# Patient Record
Sex: Female | Born: 1935 | Race: Black or African American | Hispanic: No | Smoking: Never smoker
Health system: Southern US, Community
[De-identification: ages and names within clinical notes are randomized; demographics above are authoritative.]

## PROBLEM LIST (undated history)

## (undated) DIAGNOSIS — I10 Essential (primary) hypertension: Secondary | ICD-10-CM

## (undated) DIAGNOSIS — I4892 Unspecified atrial flutter: Secondary | ICD-10-CM

## (undated) DIAGNOSIS — E785 Hyperlipidemia, unspecified: Secondary | ICD-10-CM

## (undated) HISTORY — PX: ABDOMINAL HYSTERECTOMY: SHX81

---

## 2015-03-21 DIAGNOSIS — E780 Pure hypercholesterolemia: Secondary | ICD-10-CM | POA: Diagnosis not present

## 2015-03-21 DIAGNOSIS — E785 Hyperlipidemia, unspecified: Secondary | ICD-10-CM | POA: Diagnosis not present

## 2015-03-21 DIAGNOSIS — I10 Essential (primary) hypertension: Secondary | ICD-10-CM | POA: Diagnosis not present

## 2015-03-21 DIAGNOSIS — R739 Hyperglycemia, unspecified: Secondary | ICD-10-CM | POA: Diagnosis not present

## 2015-03-21 DIAGNOSIS — Z9119 Patient's noncompliance with other medical treatment and regimen: Secondary | ICD-10-CM | POA: Diagnosis not present

## 2015-06-27 DIAGNOSIS — E785 Hyperlipidemia, unspecified: Secondary | ICD-10-CM | POA: Diagnosis not present

## 2015-06-27 DIAGNOSIS — R634 Abnormal weight loss: Secondary | ICD-10-CM | POA: Diagnosis not present

## 2015-06-27 DIAGNOSIS — I10 Essential (primary) hypertension: Secondary | ICD-10-CM | POA: Diagnosis not present

## 2015-09-25 ENCOUNTER — Other Ambulatory Visit (HOSPITAL_COMMUNITY): Payer: Self-pay | Admitting: Internal Medicine

## 2015-09-25 DIAGNOSIS — E785 Hyperlipidemia, unspecified: Secondary | ICD-10-CM | POA: Diagnosis not present

## 2015-09-25 DIAGNOSIS — R634 Abnormal weight loss: Secondary | ICD-10-CM | POA: Diagnosis not present

## 2015-09-25 DIAGNOSIS — M1990 Unspecified osteoarthritis, unspecified site: Secondary | ICD-10-CM

## 2015-09-25 DIAGNOSIS — I10 Essential (primary) hypertension: Secondary | ICD-10-CM | POA: Diagnosis not present

## 2015-09-29 ENCOUNTER — Ambulatory Visit (HOSPITAL_COMMUNITY)
Admission: RE | Admit: 2015-09-29 | Discharge: 2015-09-29 | Disposition: A | Discharge status: Home / Self Care | Payer: Commercial Managed Care - HMO | Source: Ambulatory Visit | Attending: Internal Medicine | Admitting: Internal Medicine

## 2015-09-29 DIAGNOSIS — Z780 Asymptomatic menopausal state: Secondary | ICD-10-CM | POA: Diagnosis not present

## 2015-09-29 DIAGNOSIS — M1990 Unspecified osteoarthritis, unspecified site: Secondary | ICD-10-CM | POA: Insufficient documentation

## 2015-12-18 DIAGNOSIS — R42 Dizziness and giddiness: Secondary | ICD-10-CM | POA: Diagnosis not present

## 2015-12-18 DIAGNOSIS — I10 Essential (primary) hypertension: Secondary | ICD-10-CM | POA: Diagnosis not present

## 2015-12-18 DIAGNOSIS — R634 Abnormal weight loss: Secondary | ICD-10-CM | POA: Diagnosis not present

## 2016-02-19 DIAGNOSIS — Z0000 Encounter for general adult medical examination without abnormal findings: Secondary | ICD-10-CM | POA: Diagnosis not present

## 2016-02-19 DIAGNOSIS — I10 Essential (primary) hypertension: Secondary | ICD-10-CM | POA: Diagnosis not present

## 2016-02-19 DIAGNOSIS — E785 Hyperlipidemia, unspecified: Secondary | ICD-10-CM | POA: Diagnosis not present

## 2016-02-19 DIAGNOSIS — M810 Age-related osteoporosis without current pathological fracture: Secondary | ICD-10-CM | POA: Diagnosis not present

## 2016-05-27 DIAGNOSIS — I10 Essential (primary) hypertension: Secondary | ICD-10-CM | POA: Diagnosis not present

## 2016-05-27 DIAGNOSIS — E785 Hyperlipidemia, unspecified: Secondary | ICD-10-CM | POA: Diagnosis not present

## 2016-08-29 DIAGNOSIS — E785 Hyperlipidemia, unspecified: Secondary | ICD-10-CM | POA: Diagnosis not present

## 2016-08-29 DIAGNOSIS — R636 Underweight: Secondary | ICD-10-CM | POA: Diagnosis not present

## 2016-08-29 DIAGNOSIS — I10 Essential (primary) hypertension: Secondary | ICD-10-CM | POA: Diagnosis not present

## 2016-09-27 DIAGNOSIS — Z23 Encounter for immunization: Secondary | ICD-10-CM | POA: Diagnosis not present

## 2016-11-11 DIAGNOSIS — J111 Influenza due to unidentified influenza virus with other respiratory manifestations: Secondary | ICD-10-CM | POA: Diagnosis not present

## 2016-11-11 DIAGNOSIS — I10 Essential (primary) hypertension: Secondary | ICD-10-CM | POA: Diagnosis not present

## 2017-01-17 DIAGNOSIS — I10 Essential (primary) hypertension: Secondary | ICD-10-CM | POA: Diagnosis not present

## 2017-01-17 DIAGNOSIS — E785 Hyperlipidemia, unspecified: Secondary | ICD-10-CM | POA: Diagnosis not present

## 2017-04-24 DIAGNOSIS — R636 Underweight: Secondary | ICD-10-CM | POA: Diagnosis not present

## 2017-04-24 DIAGNOSIS — E785 Hyperlipidemia, unspecified: Secondary | ICD-10-CM | POA: Diagnosis not present

## 2017-04-24 DIAGNOSIS — I10 Essential (primary) hypertension: Secondary | ICD-10-CM | POA: Diagnosis not present

## 2017-08-19 DIAGNOSIS — Z0000 Encounter for general adult medical examination without abnormal findings: Secondary | ICD-10-CM | POA: Diagnosis not present

## 2017-08-19 DIAGNOSIS — I10 Essential (primary) hypertension: Secondary | ICD-10-CM | POA: Diagnosis not present

## 2017-08-19 DIAGNOSIS — E785 Hyperlipidemia, unspecified: Secondary | ICD-10-CM | POA: Diagnosis not present

## 2017-08-19 DIAGNOSIS — R739 Hyperglycemia, unspecified: Secondary | ICD-10-CM | POA: Diagnosis not present

## 2017-08-19 DIAGNOSIS — R634 Abnormal weight loss: Secondary | ICD-10-CM | POA: Diagnosis not present

## 2017-08-19 DIAGNOSIS — Z23 Encounter for immunization: Secondary | ICD-10-CM | POA: Diagnosis not present

## 2017-08-19 DIAGNOSIS — Z1389 Encounter for screening for other disorder: Secondary | ICD-10-CM | POA: Diagnosis not present

## 2017-08-19 DIAGNOSIS — H8110 Benign paroxysmal vertigo, unspecified ear: Secondary | ICD-10-CM | POA: Diagnosis not present

## 2017-08-28 ENCOUNTER — Other Ambulatory Visit (HOSPITAL_COMMUNITY): Payer: Self-pay | Admitting: Internal Medicine

## 2017-08-28 DIAGNOSIS — N183 Chronic kidney disease, stage 3 unspecified: Secondary | ICD-10-CM

## 2017-08-28 DIAGNOSIS — I10 Essential (primary) hypertension: Secondary | ICD-10-CM | POA: Diagnosis not present

## 2017-09-02 ENCOUNTER — Ambulatory Visit (HOSPITAL_COMMUNITY)
Admission: RE | Admit: 2017-09-02 | Discharge: 2017-09-02 | Disposition: A | Discharge status: Home / Self Care | Payer: Medicare HMO | Source: Ambulatory Visit | Attending: Internal Medicine | Admitting: Internal Medicine

## 2017-09-02 DIAGNOSIS — I10 Essential (primary) hypertension: Secondary | ICD-10-CM | POA: Diagnosis not present

## 2017-09-02 DIAGNOSIS — E785 Hyperlipidemia, unspecified: Secondary | ICD-10-CM | POA: Diagnosis not present

## 2017-09-02 DIAGNOSIS — N281 Cyst of kidney, acquired: Secondary | ICD-10-CM | POA: Diagnosis not present

## 2017-09-02 DIAGNOSIS — N183 Chronic kidney disease, stage 3 unspecified: Secondary | ICD-10-CM

## 2017-09-02 DIAGNOSIS — R739 Hyperglycemia, unspecified: Secondary | ICD-10-CM | POA: Diagnosis not present

## 2017-09-02 DIAGNOSIS — N189 Chronic kidney disease, unspecified: Secondary | ICD-10-CM | POA: Diagnosis not present

## 2017-11-27 DIAGNOSIS — E785 Hyperlipidemia, unspecified: Secondary | ICD-10-CM | POA: Diagnosis not present

## 2017-11-27 DIAGNOSIS — I10 Essential (primary) hypertension: Secondary | ICD-10-CM | POA: Diagnosis not present

## 2017-11-27 DIAGNOSIS — N183 Chronic kidney disease, stage 3 (moderate): Secondary | ICD-10-CM | POA: Diagnosis not present

## 2017-11-27 DIAGNOSIS — M1990 Unspecified osteoarthritis, unspecified site: Secondary | ICD-10-CM | POA: Diagnosis not present

## 2018-02-26 DIAGNOSIS — R634 Abnormal weight loss: Secondary | ICD-10-CM | POA: Diagnosis not present

## 2018-02-26 DIAGNOSIS — I10 Essential (primary) hypertension: Secondary | ICD-10-CM | POA: Diagnosis not present

## 2018-02-26 DIAGNOSIS — E785 Hyperlipidemia, unspecified: Secondary | ICD-10-CM | POA: Diagnosis not present

## 2018-05-26 DIAGNOSIS — R634 Abnormal weight loss: Secondary | ICD-10-CM | POA: Diagnosis not present

## 2018-05-26 DIAGNOSIS — N183 Chronic kidney disease, stage 3 (moderate): Secondary | ICD-10-CM | POA: Diagnosis not present

## 2018-05-26 DIAGNOSIS — I10 Essential (primary) hypertension: Secondary | ICD-10-CM | POA: Diagnosis not present

## 2018-05-26 DIAGNOSIS — R739 Hyperglycemia, unspecified: Secondary | ICD-10-CM | POA: Diagnosis not present

## 2018-05-26 DIAGNOSIS — E785 Hyperlipidemia, unspecified: Secondary | ICD-10-CM | POA: Diagnosis not present

## 2018-08-25 DIAGNOSIS — Z1331 Encounter for screening for depression: Secondary | ICD-10-CM | POA: Diagnosis not present

## 2018-08-25 DIAGNOSIS — E43 Unspecified severe protein-calorie malnutrition: Secondary | ICD-10-CM | POA: Diagnosis not present

## 2018-08-25 DIAGNOSIS — Z23 Encounter for immunization: Secondary | ICD-10-CM | POA: Diagnosis not present

## 2018-08-25 DIAGNOSIS — N182 Chronic kidney disease, stage 2 (mild): Secondary | ICD-10-CM | POA: Diagnosis not present

## 2018-08-25 DIAGNOSIS — Z1389 Encounter for screening for other disorder: Secondary | ICD-10-CM | POA: Diagnosis not present

## 2018-08-25 DIAGNOSIS — I10 Essential (primary) hypertension: Secondary | ICD-10-CM | POA: Diagnosis not present

## 2018-08-25 DIAGNOSIS — Z0001 Encounter for general adult medical examination with abnormal findings: Secondary | ICD-10-CM | POA: Diagnosis not present

## 2018-11-24 DIAGNOSIS — E785 Hyperlipidemia, unspecified: Secondary | ICD-10-CM | POA: Diagnosis not present

## 2018-11-24 DIAGNOSIS — N182 Chronic kidney disease, stage 2 (mild): Secondary | ICD-10-CM | POA: Diagnosis not present

## 2018-11-24 DIAGNOSIS — I10 Essential (primary) hypertension: Secondary | ICD-10-CM | POA: Diagnosis not present

## 2018-11-24 DIAGNOSIS — E43 Unspecified severe protein-calorie malnutrition: Secondary | ICD-10-CM | POA: Diagnosis not present

## 2019-02-23 DIAGNOSIS — N183 Chronic kidney disease, stage 3 (moderate): Secondary | ICD-10-CM | POA: Diagnosis not present

## 2019-02-23 DIAGNOSIS — I10 Essential (primary) hypertension: Secondary | ICD-10-CM | POA: Diagnosis not present

## 2019-02-23 DIAGNOSIS — E785 Hyperlipidemia, unspecified: Secondary | ICD-10-CM | POA: Diagnosis not present

## 2019-08-19 DIAGNOSIS — Z1389 Encounter for screening for other disorder: Secondary | ICD-10-CM | POA: Diagnosis not present

## 2019-08-19 DIAGNOSIS — Z1331 Encounter for screening for depression: Secondary | ICD-10-CM | POA: Diagnosis not present

## 2019-08-19 DIAGNOSIS — E43 Unspecified severe protein-calorie malnutrition: Secondary | ICD-10-CM | POA: Diagnosis not present

## 2019-08-19 DIAGNOSIS — Z0001 Encounter for general adult medical examination with abnormal findings: Secondary | ICD-10-CM | POA: Diagnosis not present

## 2019-08-19 DIAGNOSIS — Z23 Encounter for immunization: Secondary | ICD-10-CM | POA: Diagnosis not present

## 2019-08-19 DIAGNOSIS — N1831 Chronic kidney disease, stage 3a: Secondary | ICD-10-CM | POA: Diagnosis not present

## 2019-08-19 DIAGNOSIS — I10 Essential (primary) hypertension: Secondary | ICD-10-CM | POA: Diagnosis not present

## 2019-09-18 DIAGNOSIS — I10 Essential (primary) hypertension: Secondary | ICD-10-CM | POA: Diagnosis not present

## 2019-09-18 DIAGNOSIS — N182 Chronic kidney disease, stage 2 (mild): Secondary | ICD-10-CM | POA: Diagnosis not present

## 2019-10-19 DIAGNOSIS — N182 Chronic kidney disease, stage 2 (mild): Secondary | ICD-10-CM | POA: Diagnosis not present

## 2019-10-19 DIAGNOSIS — I10 Essential (primary) hypertension: Secondary | ICD-10-CM | POA: Diagnosis not present

## 2019-11-09 ENCOUNTER — Ambulatory Visit (INDEPENDENT_AMBULATORY_CARE_PROVIDER_SITE_OTHER): Payer: Medicare HMO | Admitting: Sports Medicine

## 2019-11-09 ENCOUNTER — Other Ambulatory Visit: Payer: Self-pay

## 2019-11-09 ENCOUNTER — Encounter: Payer: Self-pay | Admitting: Sports Medicine

## 2019-11-09 VITALS — BP 124/80 | HR 93 | Temp 96.7°F | Resp 16

## 2019-11-09 DIAGNOSIS — B351 Tinea unguium: Secondary | ICD-10-CM | POA: Diagnosis not present

## 2019-11-09 DIAGNOSIS — I739 Peripheral vascular disease, unspecified: Secondary | ICD-10-CM

## 2019-11-09 DIAGNOSIS — M79674 Pain in right toe(s): Secondary | ICD-10-CM | POA: Diagnosis not present

## 2019-11-09 DIAGNOSIS — M79675 Pain in left toe(s): Secondary | ICD-10-CM

## 2019-11-09 NOTE — Progress Notes (Signed)
Subjective: Beth Terry is a 86 y.o. female patient seen today in office with complaint of mildly painful thickened and elongated toenails; unable to trim. Patient is assisted by daughter who reports that her mom's nails has gotten long unable to trim or care for herself. Patient has no other pedal complaints at this time.   Review of Systems  All other systems reviewed and are negative.    There are no problems to display for this patient.   Current Outpatient Medications on File Prior to Visit  Medication Sig Dispense Refill  . benazepril-hydrochlorthiazide (LOTENSIN HCT) 20-25 MG tablet     . rosuvastatin (CRESTOR) 5 MG tablet      No current facility-administered medications on file prior to visit.    No Known Allergies  Objective: Physical Exam  General: Well developed, nourished, no acute distress, awake, alert and oriented x 3  Vascular: Dorsalis pedis artery 1/4 bilateral, Posterior tibial artery 0/4 bilateral, skin temperature warm to warm proximal to distal bilateral lower extremities, no varicosities, pedal hair present bilateral.  Neurological: Gross sensation present via light touch bilateral.   Dermatological: Skin is warm, dry, and supple bilateral, Nails 1-10 are tender, severely long, thick, and discolored with mild subungal debris, no webspace macerations present bilateral, no open lesions present bilateral, no callus/corns/hyperkeratotic tissue present bilateral. No signs of infection bilateral.  Musculoskeletal: Asymptomatic mild hammertoe boney deformities noted bilateral. Muscular strength within normal limits without painon range of motion. No pain with calf compression bilateral.  Assessment and Plan:  Problem List Items Addressed This Visit    None    Visit Diagnoses    Pain due to onychomycosis of toenails of both feet    -  Primary   Toe pain, bilateral       PVD (peripheral vascular disease) (HCC)       Relevant Medications   benazepril-hydrochlorthiazide (LOTENSIN HCT) 20-25 MG tablet   rosuvastatin (CRESTOR) 5 MG tablet      -Examined patient.  -Discussed treatment options for painful mycotic nails. -Mechanically debrided and reduced mycotic nails with sterile nail nipper and dremel nail file without incident. -Patient to return in 3 months for follow up evaluation/nail trim or sooner if symptoms worsen.  Asencion Islam, DPM

## 2019-11-19 ENCOUNTER — Other Ambulatory Visit: Payer: Self-pay

## 2019-11-19 ENCOUNTER — Other Ambulatory Visit (HOSPITAL_COMMUNITY)
Admission: RE | Admit: 2019-11-19 | Discharge: 2019-11-19 | Disposition: A | Discharge status: Home / Self Care | Payer: Medicare HMO | Source: Ambulatory Visit | Attending: Internal Medicine | Admitting: Internal Medicine

## 2019-11-19 ENCOUNTER — Emergency Department (HOSPITAL_COMMUNITY)
Admission: EM | Admit: 2019-11-19 | Discharge: 2019-11-19 | Disposition: A | Discharge status: Home / Self Care | Payer: Medicare HMO | Attending: Emergency Medicine | Admitting: Emergency Medicine

## 2019-11-19 ENCOUNTER — Ambulatory Visit: Payer: Medicaid Other | Admitting: Cardiovascular Disease

## 2019-11-19 ENCOUNTER — Emergency Department (HOSPITAL_COMMUNITY): Payer: Medicare HMO

## 2019-11-19 ENCOUNTER — Ambulatory Visit (INDEPENDENT_AMBULATORY_CARE_PROVIDER_SITE_OTHER): Payer: Medicare HMO | Admitting: Podiatry

## 2019-11-19 ENCOUNTER — Other Ambulatory Visit (INDEPENDENT_AMBULATORY_CARE_PROVIDER_SITE_OTHER): Payer: Medicaid Other

## 2019-11-19 ENCOUNTER — Encounter (HOSPITAL_COMMUNITY): Payer: Self-pay

## 2019-11-19 ENCOUNTER — Ambulatory Visit: Payer: Medicaid Other

## 2019-11-19 DIAGNOSIS — E785 Hyperlipidemia, unspecified: Secondary | ICD-10-CM | POA: Insufficient documentation

## 2019-11-19 DIAGNOSIS — M79675 Pain in left toe(s): Secondary | ICD-10-CM | POA: Diagnosis not present

## 2019-11-19 DIAGNOSIS — I4892 Unspecified atrial flutter: Secondary | ICD-10-CM | POA: Insufficient documentation

## 2019-11-19 DIAGNOSIS — Z0001 Encounter for general adult medical examination with abnormal findings: Secondary | ICD-10-CM | POA: Insufficient documentation

## 2019-11-19 DIAGNOSIS — I10 Essential (primary) hypertension: Secondary | ICD-10-CM | POA: Insufficient documentation

## 2019-11-19 DIAGNOSIS — Z79899 Other long term (current) drug therapy: Secondary | ICD-10-CM | POA: Insufficient documentation

## 2019-11-19 DIAGNOSIS — B351 Tinea unguium: Secondary | ICD-10-CM | POA: Diagnosis not present

## 2019-11-19 DIAGNOSIS — R000 Tachycardia, unspecified: Secondary | ICD-10-CM

## 2019-11-19 DIAGNOSIS — N182 Chronic kidney disease, stage 2 (mild): Secondary | ICD-10-CM | POA: Diagnosis not present

## 2019-11-19 DIAGNOSIS — M79674 Pain in right toe(s): Secondary | ICD-10-CM

## 2019-11-19 DIAGNOSIS — I739 Peripheral vascular disease, unspecified: Secondary | ICD-10-CM

## 2019-11-19 HISTORY — DX: Essential (primary) hypertension: I10

## 2019-11-19 HISTORY — DX: Hyperlipidemia, unspecified: E78.5

## 2019-11-19 LAB — COMPREHENSIVE METABOLIC PANEL
ALT: 20 U/L (ref 0–44)
AST: 24 U/L (ref 15–41)
Albumin: 3.6 g/dL (ref 3.5–5.0)
Alkaline Phosphatase: 60 U/L (ref 38–126)
Anion gap: 13 (ref 5–15)
BUN: 27 mg/dL — ABNORMAL HIGH (ref 8–23)
CO2: 25 mmol/L (ref 22–32)
Calcium: 9.6 mg/dL (ref 8.9–10.3)
Chloride: 104 mmol/L (ref 98–111)
Creatinine, Ser: 1.1 mg/dL — ABNORMAL HIGH (ref 0.44–1.00)
GFR calc Af Amer: 51 mL/min — ABNORMAL LOW (ref >60)
GFR calc non Af Amer: 44 mL/min — ABNORMAL LOW (ref >60)
Glucose, Bld: 98 mg/dL (ref 70–99)
Potassium: 4 mmol/L (ref 3.5–5.1)
Sodium: 142 mmol/L (ref 135–145)
Total Bilirubin: 1.2 mg/dL (ref 0.3–1.2)
Total Protein: 8.1 g/dL (ref 6.5–8.1)

## 2019-11-19 LAB — BASIC METABOLIC PANEL
Anion gap: 11 (ref 5–15)
BUN: 27 mg/dL — ABNORMAL HIGH (ref 8–23)
CO2: 27 mmol/L (ref 22–32)
Calcium: 9.4 mg/dL (ref 8.9–10.3)
Chloride: 103 mmol/L (ref 98–111)
Creatinine, Ser: 1.16 mg/dL — ABNORMAL HIGH (ref 0.44–1.00)
GFR calc Af Amer: 48 mL/min — ABNORMAL LOW (ref >60)
GFR calc non Af Amer: 41 mL/min — ABNORMAL LOW (ref >60)
Glucose, Bld: 112 mg/dL — ABNORMAL HIGH (ref 70–99)
Potassium: 3.4 mmol/L — ABNORMAL LOW (ref 3.5–5.1)
Sodium: 141 mmol/L (ref 135–145)

## 2019-11-19 LAB — CBC WITH DIFFERENTIAL/PLATELET
Abs Immature Granulocytes: 0.02 K/uL (ref 0.00–0.07)
Abs Immature Granulocytes: 0.01 K/uL (ref 0.00–0.07)
Basophils Absolute: 0 K/uL (ref 0.0–0.1)
Basophils Absolute: 0 K/uL (ref 0.0–0.1)
Basophils Relative: 1 %
Basophils Relative: 1 %
Eosinophils Absolute: 0 K/uL (ref 0.0–0.5)
Eosinophils Absolute: 0 K/uL (ref 0.0–0.5)
Eosinophils Relative: 1 %
Eosinophils Relative: 0 %
HCT: 48.2 % — ABNORMAL HIGH (ref 36.0–46.0)
HCT: 49.1 % — ABNORMAL HIGH (ref 36.0–46.0)
Hemoglobin: 14.9 g/dL (ref 12.0–15.0)
Hemoglobin: 15.3 g/dL — ABNORMAL HIGH (ref 12.0–15.0)
Immature Granulocytes: 0 %
Immature Granulocytes: 0 %
Lymphocytes Relative: 15 %
Lymphocytes Relative: 14 %
Lymphs Abs: 0.8 K/uL (ref 0.7–4.0)
Lymphs Abs: 0.8 K/uL (ref 0.7–4.0)
MCH: 28.3 pg (ref 26.0–34.0)
MCH: 28.4 pg (ref 26.0–34.0)
MCHC: 30.9 g/dL (ref 30.0–36.0)
MCHC: 31.2 g/dL (ref 30.0–36.0)
MCV: 91.6 fL (ref 80.0–100.0)
MCV: 91.3 fL (ref 80.0–100.0)
Monocytes Absolute: 0.4 K/uL (ref 0.1–1.0)
Monocytes Absolute: 0.4 K/uL (ref 0.1–1.0)
Monocytes Relative: 7 %
Monocytes Relative: 7 %
Neutro Abs: 4.2 K/uL (ref 1.7–7.7)
Neutro Abs: 4.4 K/uL (ref 1.7–7.7)
Neutrophils Relative %: 76 %
Neutrophils Relative %: 78 %
Platelets: 180 K/uL (ref 150–400)
Platelets: 183 K/uL (ref 150–400)
RBC: 5.26 MIL/uL — ABNORMAL HIGH (ref 3.87–5.11)
RBC: 5.38 MIL/uL — ABNORMAL HIGH (ref 3.87–5.11)
RDW: 15.8 % — ABNORMAL HIGH (ref 11.5–15.5)
RDW: 15.9 % — ABNORMAL HIGH (ref 11.5–15.5)
WBC: 5.5 K/uL (ref 4.0–10.5)
WBC: 5.6 K/uL (ref 4.0–10.5)
nRBC: 0 % (ref 0.0–0.2)
nRBC: 0 % (ref 0.0–0.2)

## 2019-11-19 LAB — HEPATIC FUNCTION PANEL
ALT: 20 U/L (ref 0–44)
AST: 26 U/L (ref 15–41)
Albumin: 3.7 g/dL (ref 3.5–5.0)
Alkaline Phosphatase: 60 U/L (ref 38–126)
Bilirubin, Direct: 0.2 mg/dL (ref 0.0–0.2)
Indirect Bilirubin: 1.1 mg/dL — ABNORMAL HIGH (ref 0.3–0.9)
Total Bilirubin: 1.3 mg/dL — ABNORMAL HIGH (ref 0.3–1.2)
Total Protein: 8.3 g/dL — ABNORMAL HIGH (ref 6.5–8.1)

## 2019-11-19 LAB — LIPID PANEL
Cholesterol: 209 mg/dL — ABNORMAL HIGH (ref 0–200)
HDL: 94 mg/dL
LDL Cholesterol: 99 mg/dL (ref 0–99)
Total CHOL/HDL Ratio: 2.2 ratio
Triglycerides: 81 mg/dL
VLDL: 16 mg/dL (ref 0–40)

## 2019-11-19 LAB — TROPONIN I (HIGH SENSITIVITY)
Troponin I (High Sensitivity): 9 ng/L (ref <18)
Troponin I (High Sensitivity): 11 ng/L

## 2019-11-19 MED ORDER — METOPROLOL TARTRATE 25 MG PO TABS
25.0000 mg | ORAL_TABLET | Freq: Once | ORAL | Status: AC
  Administered 2019-11-19: 25 mg via ORAL
  Filled 2019-11-19: qty 1

## 2019-11-19 MED ORDER — METOPROLOL TARTRATE 25 MG PO TABS: 25.0000 mg | ORAL_TABLET | Freq: Two times a day (BID) | ORAL | 11 refills | Status: DC

## 2019-11-19 NOTE — ED Provider Notes (Signed)
[facility] EMERGENCY DEPARTMENT Provider Note   CSN: [id number] Arrival date & time: 11/19/19  1228     History Chief Complaint  Patient presents with   Tachycardia    Beth Terry is a 86 y.o. female.  Pt's daughter reports pt was seen by Dr. Legrand Rams 2 months ago and was sent to hospital to have an EKG.  Dr. Legrand Rams informed daughter pt had a fast heart rate.  Pt's daughter brought pt in today because she was out of the home and wanted to get everything done. Pt denies any complains.  No chest pain. No shortness of breath   The history is provided by the patient. No language interpreter was used.  Palpitations Palpitations quality:  Fast Onset quality:  Chronic Timing:  Constant Progression:  Worsening Relieved by:  Nothing Worsened by:  Nothing Ineffective treatments:  None tried Associated symptoms: no chest pain, no shortness of breath and no syncope   Risk factors: no hx of PE        Past Medical History:  Diagnosis Date   Hyperlipidemia    Hypertension     There are no problems to display for this patient.   Past Surgical History:  Procedure Laterality Date   ABDOMINAL HYSTERECTOMY       OB History   No obstetric history on file.     No family history on file.  Social History   Tobacco Use   Smoking status: Never Smoker   Smokeless tobacco: Never Used  Substance Use Topics   Alcohol use: Never   Drug use: Never    Home Medications Prior to Admission medications   Medication Sig Start Date End Date Taking? Authorizing Provider  benazepril-hydrochlorthiazide (LOTENSIN HCT) 20-25 MG tablet  06/08/19  Yes [provider]  rosuvastatin (CRESTOR) 5 MG tablet  06/08/19  Yes [provider]    Allergies    Patient has no known allergies.  Review of Systems   Review of Systems  Respiratory: Negative for shortness of breath.   Cardiovascular: Positive for palpitations. Negative for chest pain and syncope.  All other  systems reviewed and are negative.   Physical Exam Updated Vital Signs BP (!) 168/98 (BP Location: Left Arm)    Pulse (!) 103    Temp 97.8 F (36.6 C) (Oral)    Resp (!) 25    Ht 5\' 6"  (1.676 m)    Wt 40.8 kg    SpO2 98%    BMI 14.53 kg/m   Physical Exam Vitals and nursing note reviewed.  Constitutional:      Appearance: She is well-developed.  HENT:     Head: Normocephalic.  Cardiovascular:     Rate and Rhythm: Tachycardia present.  Pulmonary:     Effort: Pulmonary effort is normal.  Abdominal:     General: There is no distension.  Musculoskeletal:        General: Normal range of motion.     Cervical back: Normal range of motion.  Skin:    General: Skin is warm.  Neurological:     General: No focal deficit present.     Mental Status: She is alert and oriented to person, place, and time.  Psychiatric:        Mood and Affect: Mood normal.     ED Results / Procedures / Treatments   Labs (all labs ordered are listed, but only abnormal results are displayed) Labs Reviewed  CBC WITH DIFFERENTIAL/PLATELET - Abnormal; Notable for the following  components:      Result Value   RBC 5.38 (*)    Hemoglobin 15.3 (*)    HCT 49.1 (*)    RDW 15.9 (*)    All other components within normal limits  COMPREHENSIVE METABOLIC PANEL - Abnormal; Notable for the following components:   BUN 27 (*)    Creatinine, Ser 1.10 (*)    GFR calc non Af Amer 44 (*)    GFR calc Af Amer 51 (*)    All other components within normal limits  TROPONIN I (HIGH SENSITIVITY)  TROPONIN I (HIGH SENSITIVITY)    EKG EKG Interpretation  Date/Time:  Friday November 19 2019 12:35:30 EST Ventricular Rate:  127 PR Interval:    QRS Duration: 78 QT Interval:  344 QTC Calculation: 491 R Axis:   54 Text Interpretation: Atrial flutter Ventricular premature complex Probable anteroseptal infarct, old Repolarization abnormality, prob rate related Baseline wander in lead(s) II A Flutter no prior ecg for comparison  Confirmed by Alvester Chou [phone]) on 11/19/2019 1:48:57 PM   Radiology DG Chest Port 1 View  Result Date: 11/19/2019 CLINICAL DATA:  Tachycardia EXAM: PORTABLE CHEST 1 VIEW COMPARISON:  None. FINDINGS: Relative hyperinflation and hyperlucency of the lungs, could suggest underlying emphysematous changes. No consolidative opacity. No convincing features of edema. No pneumothorax or effusion. Slight right diaphragmatic eventration is noted. The aorta is calcified. Cardiac silhouette appears borderline enlarged. No acute osseous or soft tissue abnormality. IMPRESSION: 1. Enlarged cardiac silhouette, may reflect cardiomegaly or pericardial effusion. 2. Findings suggestive of emphysematous changes. 3.  Aortic Atherosclerosis (ICD10-I70.0). Electronically Signed   By: Kreg Shropshire M.D.   On: 11/19/2019 15:15    Procedures Procedures (including critical care time)  Medications Ordered in ED Medications - No data to display  ED Course  I have reviewed the triage vital signs and the nursing notes.  Pertinent labs & imaging results that were available during my care of the patient were reviewed by me and considered in my medical decision making (see chart for details).    MDM Rules/Calculators/A&P                      MDM:  Pt's troponin is negative x 2.  I spoke to cardiology.  Dr. Cristal Deer advised to try metoprolol 25mg .  (try in Ed to make sure pt tolerates) I spoke with with pt about starting eliquis.  Pt does not want to start on a blood thinner.  Pt takes an aspirin every day. Final Clinical Impression(s) / ED Diagnoses Final diagnoses:  Atrial flutter, unspecified type (HCC)    Rx / DC Orders ED Discharge Orders         Ordered    metoprolol tartrate (LOPRESSOR) 25 MG tablet  2 times daily     11/19/19 1926           01/17/20 11/19/19 1930    01/17/20, MD 11/19/19 2045

## 2019-11-19 NOTE — ED Triage Notes (Signed)
Pt was sent by Dr Felecia Shelling due to elevated HR. Initially sent as a nurse visit at cardiology and EKG. EKG showed a flutter and HR 128. Denies pain

## 2019-11-19 NOTE — ED Notes (Signed)
Lab work collected on pt at cardiology

## 2019-11-19 NOTE — Discharge Instructions (Addendum)
Continue taking your aspirin every day.  Schedule to see Cardiology for evaluation.  Take metoprolol to try to slow your heart rate.

## 2019-11-19 NOTE — Progress Notes (Signed)
Pt arrives to cardiology for EKG per Dr.Fanta for tachycardia, Dr.Koneswaran will read

## 2019-11-23 ENCOUNTER — Encounter: Payer: Self-pay | Admitting: Cardiovascular Disease

## 2019-11-23 ENCOUNTER — Ambulatory Visit (INDEPENDENT_AMBULATORY_CARE_PROVIDER_SITE_OTHER): Payer: Medicare HMO | Admitting: Cardiovascular Disease

## 2019-11-23 ENCOUNTER — Encounter: Payer: Self-pay | Admitting: Podiatry

## 2019-11-23 ENCOUNTER — Other Ambulatory Visit: Payer: Self-pay

## 2019-11-23 VITALS — BP 156/100 | HR 62 | Ht 64.0 in | Wt 92.0 lb

## 2019-11-23 DIAGNOSIS — I10 Essential (primary) hypertension: Secondary | ICD-10-CM | POA: Diagnosis not present

## 2019-11-23 DIAGNOSIS — Z7189 Other specified counseling: Secondary | ICD-10-CM | POA: Diagnosis not present

## 2019-11-23 DIAGNOSIS — I4892 Unspecified atrial flutter: Secondary | ICD-10-CM | POA: Diagnosis not present

## 2019-11-23 MED ORDER — APIXABAN 2.5 MG PO TABS: 2.5000 mg | ORAL_TABLET | Freq: Two times a day (BID) | ORAL | 0 refills | Status: DC

## 2019-11-23 MED ORDER — APIXABAN 2.5 MG PO TABS: 2.5000 mg | ORAL_TABLET | Freq: Two times a day (BID) | ORAL | 6 refills | Status: DC

## 2019-11-23 NOTE — Progress Notes (Signed)
  Subjective:  Patient ID: Beth Terry, female    DOB: Sep 12, 1927,  MRN: [id number]  Chief Complaint  Patient presents with  . Foot Pain    pt is here for pain and discoloration in the toes, pt states that she is also looking to get a nail trim, pt is also concerned with foot pain on top of the left foot, going on for about 3-4 months, pain is elevated at night time.   86 y.o. female returns for the above complaint.  Patient presents with a concern for thickened elongated mycotic toenails x10.  Patient is known to Dr. Marylene Land for nail care.  However patient states that she has pain him the lower extremity when she has been walking for a while.  She states that the pain is also worsened at night when she has been sleeping.  Pain is resolved when foot is in a dependent position.  She would like to know if there is anything that could be done for this pain.  Objective:  There were no vitals filed for this visit. Podiatric Exam: Vascular: Diminished dorsalis pedis and posterior tibial pulses. Capillary return greater than 5 seconds. Temperature gradient is WNL. Skin turgor WNL  Sensorium: Normal Semmes Weinstein monofilament test. Normal tactile sensation bilaterally. Nail Exam: Pt has thick disfigured discolored nails with subungual debris noted bilateral entire nail hallux through fifth toenails Ulcer Exam: There is no evidence of ulcer or pre-ulcerative changes or infection. Orthopedic Exam: Muscle tone and strength are WNL. No limitations in general ROM. No crepitus or effusions noted. HAV  B/L.  Hammer toes 2-5  B/L. Skin: No Porokeratosis. No infection or ulcers  Assessment & Plan:  Patient was evaluated and treated and all questions answered.   Peripheral vascular disease -I explained to the patient the etiology of peripheral vascular disease and the claudication type of pain that patient is experiencing.  I explained to her there are various treatment options associated with it.  I  would like the patient to obtain vascular studies followed by follow-up with a vascular specialist.  Patient states understanding and will obtain the vascular studies for baseline vascular flow to the lower extremity.  If the vascular studies are positive for decreased flow I will refer the patient over to a vascular specialist for evaluation and management.   Onychomycosis with pain  -Nails palliatively debrided as below. -Educated on self-care  Procedure: Nail Debridement Rationale: pain  Type of Debridement: manual, sharp debridement. Instrumentation: Nail nipper, rotary burr. Number of Nails: 10  Procedures and Treatment: Consent by patient was obtained for treatment procedures. The patient understood the discussion of treatment and procedures well. All questions were answered thoroughly reviewed. Debridement of mycotic and hypertrophic toenails, 1 through 5 bilateral and clearing of subungual debris. No ulceration, no infection noted.  Return Visit-Office Procedure: Patient instructed to return to the office for a follow up visit 3 months for continued evaluation and treatment.  Nicholes Rough, DPM    No follow-ups on file.

## 2019-11-23 NOTE — Addendum Note (Signed)
Addended by: Lesle Chris on: 11/23/2019 12:02 PM   Modules accepted: Orders

## 2019-11-23 NOTE — Patient Instructions (Addendum)
Medication Instructions:   Stop Aspirin.      Begin Eliquis 2.5mg  twice a day   Continue all other current medications.  Labwork: none  Testing/Procedures:  Your physician has requested that you have an echocardiogram. Echocardiography is a painless test that uses sound waves to create images of your heart. It provides your doctor with information about the size and shape of your heart and how well your heart's chambers and valves are working. This procedure takes approximately one hour. There are no restrictions for this procedure. - DUE IN 2 WEEKS   Office will contact with results via phone or letter.    Follow-Up: 2 months   Any Other Special Instructions Will Be Listed Below (If Applicable). Nurse visit for EKG & vitals in 3 weeks.   If you need a refill on your cardiac medications before your next appointment, please call your pharmacy.

## 2019-11-23 NOTE — Progress Notes (Signed)
CARDIOLOGY CONSULT NOTE  Patient ID: Beth Terry MRN: [id number] DOB/AGE: 1926-10-19 86 y.o.  Admit date: (Not on file) Primary Physician: Avon Gully, MD  Reason for Consultation: Atrial flutter  HPI: Beth Terry is a 86 y.o. female who is being seen today for the evaluation of atrial flutter at the request of Avon Gully, MD.   I personally reviewed the ECG performed on 11/19/2019 which demonstrated rapid atrial flutter, 127 bpm.  Her PCP had sent her to the cardiology office for an EKG due to tachycardia that day.  After interpreting the EKG I asked my nurse that the patient be directed to the ED for heart rate control.  Past medical history also includes hypertension.  Blood pressure in the ED was 168/98.  She is also markedly hypertensive today.  Several blood tests were checked which included hemoglobin 15.3, BUN 27, creatinine 1.1.  Troponins were normal.  Chest x-ray showed an enlarged cardiac silhouette which may reflect cardiomegaly or pericardial effusion.  There were findings suggestive of emphysematous changes as well as aortic atherosclerosis.  She was started on metoprolol.  She was offered Eliquis for systemic anticoagulation but refused.  She takes an aspirin daily.  It appears she is has not received metoprolol yet.  Her daughter, Beth Terry, who has accompanied her today plans to go pick it up from the pharmacy today.  The patient denies any symptoms of chest pain, palpitations, shortness of breath, lightheadedness, dizziness, leg swelling, orthopnea, PND, and syncope.       No Known Allergies  Current Outpatient Medications  Medication Sig Dispense Refill  . benazepril-hydrochlorthiazide (LOTENSIN HCT) 20-25 MG tablet Take 1 tablet by mouth daily.     Marland Kitchen donepezil (ARICEPT) 5 MG tablet Take 5 mg by mouth daily.    . rosuvastatin (CRESTOR) 5 MG tablet Take 5 mg by mouth at bedtime.     . metoprolol tartrate (LOPRESSOR) 25 MG  tablet Take 1 tablet (25 mg total) by mouth 2 (two) times daily. (Patient not taking: Reported on 11/23/2019) 60 tablet 11   No current facility-administered medications for this visit.    Past Medical History:  Diagnosis Date  . Hyperlipidemia   . Hypertension     Past Surgical History:  Procedure Laterality Date  . ABDOMINAL HYSTERECTOMY      Social History   Socioeconomic History  . Marital status: Widowed    Spouse name: Not on file  . Number of children: Not on file  . Years of education: Not on file  . Highest education level: Not on file  Occupational History  . Not on file  Tobacco Use  . Smoking status: Never Smoker  . Smokeless tobacco: Never Used  Substance and Sexual Activity  . Alcohol use: Never  . Drug use: Never  . Sexual activity: Not on file  Other Topics Concern  . Not on file  Social History Narrative  . Not on file   Social Determinants of Health   Financial Resource Strain:   . Difficulty of Paying Living Expenses: Not on file  Food Insecurity:   . Worried About Programme researcher, broadcasting/film/video in the Last Year: Not on file  . Ran Out of Food in the Last Year: Not on file  Transportation Needs:   . Lack of Transportation (Medical): Not on file  . Lack of Transportation (Non-Medical): Not on file  Physical Activity:   . Days of Exercise per Week: Not on file  .  Minutes of Exercise per Session: Not on file  Stress:   . Feeling of Stress : Not on file  Social Connections:   . Frequency of Communication with Friends and Family: Not on file  . Frequency of Social Gatherings with Friends and Family: Not on file  . Attends Religious Services: Not on file  . Active Member of Clubs or Organizations: Not on file  . Attends Archivist Meetings: Not on file  . Marital Status: Not on file  Intimate Partner Violence:   . Fear of Current or Ex-Partner: Not on file  . Emotionally Abused: Not on file  . Physically Abused: Not on file  . Sexually Abused:  Not on file     No family history of premature CAD in 1st degree relatives.  Current Meds  Medication Sig  . benazepril-hydrochlorthiazide (LOTENSIN HCT) 20-25 MG tablet Take 1 tablet by mouth daily.   Marland Kitchen donepezil (ARICEPT) 5 MG tablet Take 5 mg by mouth daily.  . rosuvastatin (CRESTOR) 5 MG tablet Take 5 mg by mouth at bedtime.       Review of systems complete and found to be negative unless listed above in HPI    Physical exam Blood pressure (!) 160/105, pulse (!) 101, height 5\' 4"  (1.626 m), weight 92 lb (41.7 kg). General: NAD Neck: No JVD, no thyromegaly or thyroid nodule.  Lungs: Clear to auscultation bilaterally with normal respiratory effort. CV: Nondisplaced PMI.  Tachycardic, irregular rhythm, normal S1/S2, no S3, no murmur.  No peripheral edema.     Abdomen: Soft, nontender, no distention.  Skin: Intact without lesions or rashes.  Neurologic: Alert and oriented x 3.  Psych: Normal affect. Extremities: No clubbing or cyanosis.  HEENT: Normal.   ECG: Most recent ECG reviewed.   Labs: Lab Results  Component Value Date/Time   K 4.0 11/19/2019 03:07 PM   BUN 27 (H) 11/19/2019 03:07 PM   CREATININE 1.10 (H) 11/19/2019 03:07 PM   ALT 20 11/19/2019 03:07 PM   HGB 15.3 (H) 11/19/2019 03:07 PM     Lipids: Lab Results  Component Value Date/Time   LDLCALC 99 11/19/2019 11:09 AM   CHOL 209 (H) 11/19/2019 11:09 AM   TRIG 81 11/19/2019 11:09 AM   HDL 94 11/19/2019 11:09 AM        ASSESSMENT AND PLAN:  1.  Rapid atrial flutter: She will start metoprolol tartrate 25 mg twice daily today as prescribed in the ED.  I had a long discussion with her about the risk of thromboembolic disease.  I will discontinue aspirin and start low-dose apixaban 2.5 mg twice daily given her age and weight (41.8 kg).  After heart rate is controlled I will obtain an echocardiogram to evaluate cardiac structure and function.  I will obtain an EKG in the office in about 3 weeks.  I have asked  the patient's daughter to purchase a blood pressure cuff so heart rate and blood pressure can be checked at home.  2.  Hypertension: Blood pressure is significantly elevated.  She is starting metoprolol today.  This will need further monitoring.  I have encouraged the patient's daughter to purchase a blood pressure cuff so this can be monitored at home.     Disposition: Follow up in 2 months virtual visit   Signed: Kate Sable, M.D., F.A.C.C.  11/23/2019, 8:21 AM

## 2019-12-07 ENCOUNTER — Telehealth: Payer: Self-pay | Admitting: *Deleted

## 2019-12-07 DIAGNOSIS — I739 Peripheral vascular disease, unspecified: Secondary | ICD-10-CM

## 2019-12-07 DIAGNOSIS — M79674 Pain in right toe(s): Secondary | ICD-10-CM

## 2019-12-07 NOTE — Telephone Encounter (Signed)
Faxed orders to [facility].

## 2019-12-07 NOTE — Telephone Encounter (Signed)
-----   Message from Candelaria Stagers, DPM sent at 11/23/2019  8:01 AM EST ----- Regarding: Vascular studies Hi Valery,  Would you be able to order vascular studies for this patient with ABIs/PVRs.  Thanks Caryn Bee

## 2019-12-07 NOTE — Telephone Encounter (Addendum)
[facility] states their first available is [phone] is the end of March.

## 2019-12-08 ENCOUNTER — Other Ambulatory Visit: Payer: Self-pay | Admitting: Podiatry

## 2019-12-08 DIAGNOSIS — I739 Peripheral vascular disease, unspecified: Secondary | ICD-10-CM

## 2019-12-09 ENCOUNTER — Other Ambulatory Visit: Payer: Self-pay

## 2019-12-09 ENCOUNTER — Ambulatory Visit (INDEPENDENT_AMBULATORY_CARE_PROVIDER_SITE_OTHER): Payer: Medicare HMO

## 2019-12-09 DIAGNOSIS — I4892 Unspecified atrial flutter: Secondary | ICD-10-CM

## 2019-12-10 ENCOUNTER — Telehealth: Payer: Self-pay | Admitting: *Deleted

## 2019-12-10 NOTE — Telephone Encounter (Signed)
Beth Terry, [place]  04/19/6150 83:43 AM EST    Daughter Beth Terry) notified & verbalized understanding. Copy to pcp. Next f/u scheduled for 01/25/2020 - vv.

## 2019-12-10 NOTE — Telephone Encounter (Signed)
-----   Message from Laqueta Linden, MD sent at 12/10/2019  9:31 AM EST ----- Cardiac function is mild to moderately weak and I suspect this is due to uncontrolled tachycardia. As stated at the time of her office visit, her heart rate will need to be monitored to make sure it is well controlled. Moderate mitral and aortic valve as well as moderate to severe tricuspid valve leakage is noted. I will manage with medications.

## 2019-12-14 ENCOUNTER — Ambulatory Visit: Payer: Medicaid Other

## 2019-12-17 DIAGNOSIS — E785 Hyperlipidemia, unspecified: Secondary | ICD-10-CM | POA: Diagnosis not present

## 2019-12-17 DIAGNOSIS — N182 Chronic kidney disease, stage 2 (mild): Secondary | ICD-10-CM | POA: Diagnosis not present

## 2019-12-23 ENCOUNTER — Ambulatory Visit (INDEPENDENT_AMBULATORY_CARE_PROVIDER_SITE_OTHER): Payer: Medicare HMO | Admitting: *Deleted

## 2019-12-23 ENCOUNTER — Other Ambulatory Visit: Payer: Self-pay

## 2019-12-23 VITALS — BP 158/92 | HR 107 | Wt 91.4 lb

## 2019-12-23 DIAGNOSIS — I4892 Unspecified atrial flutter: Secondary | ICD-10-CM

## 2019-12-23 DIAGNOSIS — R000 Tachycardia, unspecified: Secondary | ICD-10-CM | POA: Diagnosis not present

## 2019-12-23 NOTE — Progress Notes (Signed)
Patient in office for EKG & vitals.  Heart rate 107 - 117.  Patient states that she feels fine & can not tell that heart is beating that fast.  No complaints this morning.  Daughter Chyrl Civatte) present for nurse visit.   [phone])

## 2019-12-23 NOTE — Progress Notes (Signed)
If she has not missed any doses of Eliquis, please arrange for direct-current cardioversion next week at [facility].

## 2019-12-29 NOTE — Progress Notes (Signed)
Spoke with daughter Chyrl Civatte) & explained procedure in detail - states that she is not sure about doing this - prefers to discuss with provider before making final decision.  VV scheduled for 01/03/2020 at 9:20.    The patient verbally consented for a telehealth phone visit with [facility] and understands that his/her insurance company will be billed for the encounter.

## 2020-01-04 ENCOUNTER — Encounter: Payer: Self-pay | Admitting: Cardiovascular Disease

## 2020-01-04 ENCOUNTER — Telehealth (INDEPENDENT_AMBULATORY_CARE_PROVIDER_SITE_OTHER): Payer: Medicare HMO | Admitting: Cardiovascular Disease

## 2020-01-04 VITALS — BP 101/64 | HR 82 | Ht 65.0 in | Wt 90.0 lb

## 2020-01-04 DIAGNOSIS — Z7901 Long term (current) use of anticoagulants: Secondary | ICD-10-CM

## 2020-01-04 DIAGNOSIS — R000 Tachycardia, unspecified: Secondary | ICD-10-CM

## 2020-01-04 DIAGNOSIS — I10 Essential (primary) hypertension: Secondary | ICD-10-CM | POA: Diagnosis not present

## 2020-01-04 DIAGNOSIS — I4892 Unspecified atrial flutter: Secondary | ICD-10-CM | POA: Diagnosis not present

## 2020-01-04 MED ORDER — METOPROLOL SUCCINATE ER 25 MG PO TB24: 25.0000 mg | ORAL_TABLET | Freq: Two times a day (BID) | ORAL | 6 refills | Status: AC

## 2020-01-04 NOTE — Addendum Note (Signed)
Addended by: Lesle Chris on: 01/04/2020 09:01 AM   Modules accepted: Orders

## 2020-01-04 NOTE — Progress Notes (Signed)
Virtual Visit via Telephone Note   This visit type was conducted due to national recommendations for restrictions regarding the COVID-19 Pandemic (e.g. social distancing) in an effort to limit this patient's exposure and mitigate transmission in our community.  Due to her co-morbid illnesses, this patient is at least at moderate risk for complications without adequate follow up.  This format is felt to be most appropriate for this patient at this time.  The patient did not have access to video technology/had technical difficulties with video requiring transitioning to audio format only (telephone).  All issues noted in this document were discussed and addressed.  No physical exam could be performed with this format.  Please refer to the patient's chart for her  consent to telehealth for [facility].   The patient was identified using 2 identifiers.  Date:  01/04/2020   ID:  Beth, Terry [birth year], MRN [id number]  Patient Location: Home Provider Location: Office  PCP:  Avon Gully, MD  Cardiologist:  Prentice Docker, MD  Electrophysiologist:  None   Evaluation Performed:  Follow-Up Visit  Chief Complaint:  Atrial flutter  History of Present Illness:    Beth Terry is a 86 y.o. female with atrial flutter.  I initially evaluated her on 11/23/2019 and started her on Lopressor 25 mg twice daily.  I obtained a follow-up ECG on 12/23/2019 which demonstrated rapid atrial flutter, 117 bpm.  I then communicated my suggestion about proceeding with direct-current cardioversion.  Echocardiogram demonstrated moderately reduced LV systolic function, EF 40%.  I spoke to the patient's daughter today, Beth Terry.  It appears that they are not interested in proceeding with cardioversion given the patient's advanced age.  The patient denies chest pain, palpitations, leg swelling, and shortness of breath.     Past Medical History:  Diagnosis Date  . Hyperlipidemia   .  Hypertension    Past Surgical History:  Procedure Laterality Date  . ABDOMINAL HYSTERECTOMY       Current Meds  Medication Sig  . apixaban (ELIQUIS) 2.5 MG TABS tablet Take 1 tablet (2.5 mg total) by mouth 2 (two) times daily.  . benazepril-hydrochlorthiazide (LOTENSIN HCT) 20-25 MG tablet Take 1 tablet by mouth daily.   Marland Kitchen donepezil (ARICEPT) 5 MG tablet Take 5 mg by mouth daily.  . metoprolol tartrate (LOPRESSOR) 25 MG tablet Take 1 tablet (25 mg total) by mouth 2 (two) times daily.  . rosuvastatin (CRESTOR) 5 MG tablet Take 5 mg by mouth at bedtime.      Allergies:   Patient has no known allergies.   Social History   Tobacco Use  . Smoking status: Never Smoker  . Smokeless tobacco: Never Used  Substance Use Topics  . Alcohol use: Never  . Drug use: Never     Family Hx: The patient's family history is not on file.  ROS:   Please see the history of present illness.     All other systems reviewed and are negative.   Prior CV studies:   The following studies were reviewed today:  Echocardiogram 12/09/19:  1. Left ventricular ejection fraction, by estimation, is 40%. The left  ventricle has mildly decreased function. The left ventricle demonstrates  regional wall motion abnormalities (see scoring diagram/findings for  description). Left ventricular diastolic  parameters are indeterminate.  2. Right ventricular systolic function is moderately reduced. The right  ventricular size is mildly enlarged. There is moderately elevated  pulmonary artery systolic pressure.  3. Left atrial size was severely  dilated.  4. Right atrial size was severely dilated.  5. There is partial prolapse of the posterior MV leaflet. The mitral  valve is abnormal. Moderate mitral valve regurgitation. No evidence of  mitral stenosis.  6. Tricuspid valve regurgitation is moderate to severe.  7. The aortic valve is tricuspid. Aortic valve regurgitation is moderate.  No aortic stenosis is  present.  8. The inferior vena cava is normal in size with greater than 50%  respiratory variability, suggesting right atrial pressure of 3 mmHg.   Labs/Other Tests and Data Reviewed:    EKG:  An ECG dated 12/23/2019 was personally reviewed today and demonstrated:  Rapid atrial flutter with variable conduction, 117 bpm.  Recent Labs: 11/19/2019: ALT 20; BUN 27; Creatinine, Ser 1.10; Hemoglobin 15.3; Platelets 183; Potassium 4.0; Sodium 142   Recent Lipid Panel Lab Results  Component Value Date/Time   CHOL 209 (H) 11/19/2019 11:09 AM   TRIG 81 11/19/2019 11:09 AM   HDL 94 11/19/2019 11:09 AM   CHOLHDL 2.2 11/19/2019 11:09 AM   LDLCALC 99 11/19/2019 11:09 AM    Wt Readings from Last 3 Encounters:  01/04/20 90 lb (40.8 kg)  12/23/19 91 lb 6.4 oz (41.5 kg)  11/23/19 92 lb (41.7 kg)     Objective:    Vital Signs:  BP 101/64   Pulse 82   Ht 5\' 5"  (1.651 m)   Wt 90 lb (40.8 kg)   BMI 14.98 kg/m    VITAL SIGNS:  reviewed  ASSESSMENT & PLAN:    1.  Rapid atrial flutter: Currently on Lopressor 25 mg twice daily.  As per patient's reported heart rate it appears to be controlled.  She is also taking low-dose apixaban 2.5 mg twice daily given her age and weight.  Echocardiogram demonstrated LVEF 40%, I suspect which is tachycardia mediated.  I discussed proceeding with direct-current cardioversion with the patient's daughter and they are not inclined to proceed.  Hence, I will manage medically.  Given reduced LVEF, I will switch Lopressor to Toprol-XL 25 mg twice daily.  I will have the patient return for a follow-up ECG in 3 weeks.  2.  Hypertension: Blood pressure is normal.  I will monitor given medication adjustments as noted above.    COVID-19 Education: The signs and symptoms of COVID-19 were discussed with the patient and how to seek care for testing (follow up with PCP or arrange E-visit).  The importance of social distancing was discussed today.  Time:   Today, I have  spent 25 minutes with the patient with telehealth technology discussing the above problems.     Medication Adjustments/Labs and Tests Ordered: Current medicines are reviewed at length with the patient today.  Concerns regarding medicines are outlined above.   Tests Ordered: No orders of the defined types were placed in this encounter.   Medication Changes: No orders of the defined types were placed in this encounter.   Follow Up:  Virtual Visit  in 3 month(s)  Signed, Kate Sable, MD  01/04/2020 8:34 AM    [place]

## 2020-01-04 NOTE — Patient Instructions (Addendum)
Medication Instructions:   Stop Lopressor (Metoprolol Tart) - short acting.   Begin Toprol XL 25mg  (Metoprolol Succinate - long acting) twice a day - new prescription sent to [facility].   Continue all other medications.    Labwork: none  Testing/Procedures: none  Follow-Up: 3 months - phone call   Any Other Special Instructions Will Be Listed Below (If Applicable). 3 weeks - nurse visit for EKG only   If you need a refill on your cardiac medications before your next appointment, please call your pharmacy.

## 2020-01-04 NOTE — Addendum Note (Signed)
Addended by: Lesle Chris on: 01/04/2020 09:11 AM   Modules accepted: Orders

## 2020-01-12 ENCOUNTER — Other Ambulatory Visit: Payer: Self-pay

## 2020-01-12 ENCOUNTER — Ambulatory Visit (INDEPENDENT_AMBULATORY_CARE_PROVIDER_SITE_OTHER): Payer: Medicare HMO

## 2020-01-12 ENCOUNTER — Other Ambulatory Visit: Payer: Self-pay | Admitting: Podiatry

## 2020-01-12 DIAGNOSIS — I739 Peripheral vascular disease, unspecified: Secondary | ICD-10-CM

## 2020-01-12 DIAGNOSIS — R0989 Other specified symptoms and signs involving the circulatory and respiratory systems: Secondary | ICD-10-CM | POA: Diagnosis not present

## 2020-01-12 DIAGNOSIS — M79674 Pain in right toe(s): Secondary | ICD-10-CM

## 2020-01-17 DIAGNOSIS — E785 Hyperlipidemia, unspecified: Secondary | ICD-10-CM | POA: Diagnosis not present

## 2020-01-17 DIAGNOSIS — N182 Chronic kidney disease, stage 2 (mild): Secondary | ICD-10-CM | POA: Diagnosis not present

## 2020-01-25 ENCOUNTER — Ambulatory Visit: Payer: Medicaid Other

## 2020-01-25 ENCOUNTER — Telehealth: Payer: Medicaid Other | Admitting: Cardiovascular Disease

## 2020-02-09 ENCOUNTER — Inpatient Hospital Stay (HOSPITAL_COMMUNITY)
Admission: EM | Admit: 2020-02-09 | Discharge: 2020-02-11 | DRG: 389 | Disposition: A | Discharge status: Hospice | Payer: Medicare HMO | Attending: Family Medicine | Admitting: Family Medicine

## 2020-02-09 ENCOUNTER — Inpatient Hospital Stay (HOSPITAL_COMMUNITY): Payer: Medicare HMO

## 2020-02-09 ENCOUNTER — Emergency Department (HOSPITAL_COMMUNITY): Payer: Medicare HMO

## 2020-02-09 ENCOUNTER — Other Ambulatory Visit: Payer: Self-pay

## 2020-02-09 ENCOUNTER — Encounter (HOSPITAL_COMMUNITY): Payer: Self-pay

## 2020-02-09 DIAGNOSIS — Z79899 Other long term (current) drug therapy: Secondary | ICD-10-CM | POA: Diagnosis not present

## 2020-02-09 DIAGNOSIS — R404 Transient alteration of awareness: Secondary | ICD-10-CM | POA: Diagnosis not present

## 2020-02-09 DIAGNOSIS — Z66 Do not resuscitate: Secondary | ICD-10-CM | POA: Diagnosis not present

## 2020-02-09 DIAGNOSIS — R109 Unspecified abdominal pain: Secondary | ICD-10-CM | POA: Diagnosis not present

## 2020-02-09 DIAGNOSIS — E860 Dehydration: Secondary | ICD-10-CM | POA: Diagnosis not present

## 2020-02-09 DIAGNOSIS — I429 Cardiomyopathy, unspecified: Secondary | ICD-10-CM | POA: Diagnosis present

## 2020-02-09 DIAGNOSIS — Z20822 Contact with and (suspected) exposure to covid-19: Secondary | ICD-10-CM | POA: Diagnosis present

## 2020-02-09 DIAGNOSIS — E785 Hyperlipidemia, unspecified: Secondary | ICD-10-CM | POA: Diagnosis not present

## 2020-02-09 DIAGNOSIS — Z4659 Encounter for fitting and adjustment of other gastrointestinal appliance and device: Secondary | ICD-10-CM

## 2020-02-09 DIAGNOSIS — R188 Other ascites: Secondary | ICD-10-CM | POA: Diagnosis present

## 2020-02-09 DIAGNOSIS — I110 Hypertensive heart disease with heart failure: Secondary | ICD-10-CM | POA: Diagnosis present

## 2020-02-09 DIAGNOSIS — N179 Acute kidney failure, unspecified: Secondary | ICD-10-CM | POA: Diagnosis not present

## 2020-02-09 DIAGNOSIS — Z01810 Encounter for preprocedural cardiovascular examination: Secondary | ICD-10-CM | POA: Diagnosis not present

## 2020-02-09 DIAGNOSIS — Z515 Encounter for palliative care: Secondary | ICD-10-CM

## 2020-02-09 DIAGNOSIS — E872 Acidosis, unspecified: Secondary | ICD-10-CM | POA: Diagnosis present

## 2020-02-09 DIAGNOSIS — K56609 Unspecified intestinal obstruction, unspecified as to partial versus complete obstruction: Secondary | ICD-10-CM | POA: Diagnosis not present

## 2020-02-09 DIAGNOSIS — Z8249 Family history of ischemic heart disease and other diseases of the circulatory system: Secondary | ICD-10-CM | POA: Diagnosis not present

## 2020-02-09 DIAGNOSIS — F0390 Unspecified dementia without behavioral disturbance: Secondary | ICD-10-CM | POA: Diagnosis present

## 2020-02-09 DIAGNOSIS — K5669 Other partial intestinal obstruction: Principal | ICD-10-CM | POA: Diagnosis present

## 2020-02-09 DIAGNOSIS — I083 Combined rheumatic disorders of mitral, aortic and tricuspid valves: Secondary | ICD-10-CM | POA: Diagnosis present

## 2020-02-09 DIAGNOSIS — Z7189 Other specified counseling: Secondary | ICD-10-CM

## 2020-02-09 DIAGNOSIS — I10 Essential (primary) hypertension: Secondary | ICD-10-CM | POA: Diagnosis present

## 2020-02-09 DIAGNOSIS — Z90710 Acquired absence of both cervix and uterus: Secondary | ICD-10-CM

## 2020-02-09 DIAGNOSIS — Z4682 Encounter for fitting and adjustment of non-vascular catheter: Secondary | ICD-10-CM | POA: Diagnosis not present

## 2020-02-09 DIAGNOSIS — Z978 Presence of other specified devices: Secondary | ICD-10-CM

## 2020-02-09 DIAGNOSIS — I4891 Unspecified atrial fibrillation: Secondary | ICD-10-CM | POA: Diagnosis not present

## 2020-02-09 DIAGNOSIS — Z7401 Bed confinement status: Secondary | ICD-10-CM | POA: Diagnosis not present

## 2020-02-09 DIAGNOSIS — Z7901 Long term (current) use of anticoagulants: Secondary | ICD-10-CM | POA: Diagnosis not present

## 2020-02-09 DIAGNOSIS — I5042 Chronic combined systolic (congestive) and diastolic (congestive) heart failure: Secondary | ICD-10-CM | POA: Diagnosis not present

## 2020-02-09 DIAGNOSIS — R000 Tachycardia, unspecified: Secondary | ICD-10-CM | POA: Diagnosis not present

## 2020-02-09 DIAGNOSIS — D689 Coagulation defect, unspecified: Secondary | ICD-10-CM | POA: Diagnosis not present

## 2020-02-09 DIAGNOSIS — I959 Hypotension, unspecified: Secondary | ICD-10-CM | POA: Diagnosis present

## 2020-02-09 DIAGNOSIS — I4892 Unspecified atrial flutter: Secondary | ICD-10-CM | POA: Diagnosis not present

## 2020-02-09 DIAGNOSIS — A419 Sepsis, unspecified organism: Secondary | ICD-10-CM

## 2020-02-09 DIAGNOSIS — I700 Atherosclerosis of aorta: Secondary | ICD-10-CM | POA: Diagnosis present

## 2020-02-09 DIAGNOSIS — R918 Other nonspecific abnormal finding of lung field: Secondary | ICD-10-CM | POA: Diagnosis not present

## 2020-02-09 DIAGNOSIS — R531 Weakness: Secondary | ICD-10-CM | POA: Diagnosis not present

## 2020-02-09 HISTORY — DX: Unspecified atrial flutter: I48.92

## 2020-02-09 LAB — URINALYSIS, ROUTINE W REFLEX MICROSCOPIC
Bilirubin Urine: NEGATIVE
Glucose, UA: NEGATIVE mg/dL
Ketones, ur: NEGATIVE mg/dL
Leukocytes,Ua: NEGATIVE
Nitrite: NEGATIVE
Protein, ur: >=300 mg/dL — AB
Specific Gravity, Urine: 1.022 (ref 1.005–1.030)
pH: 5 (ref 5.0–8.0)

## 2020-02-09 LAB — COMPREHENSIVE METABOLIC PANEL
ALT: <5 U/L (ref 0–44)
AST: 33 U/L (ref 15–41)
Albumin: 4.2 g/dL (ref 3.5–5.0)
Alkaline Phosphatase: 48 U/L (ref 38–126)
Anion gap: 20 — ABNORMAL HIGH (ref 5–15)
BUN: 59 mg/dL — ABNORMAL HIGH (ref 8–23)
CO2: 24 mmol/L (ref 22–32)
Calcium: 9.5 mg/dL (ref 8.9–10.3)
Chloride: 95 mmol/L — ABNORMAL LOW (ref 98–111)
Creatinine, Ser: 2.19 mg/dL — ABNORMAL HIGH (ref 0.44–1.00)
GFR calc Af Amer: 22 mL/min — ABNORMAL LOW (ref >60)
GFR calc non Af Amer: 19 mL/min — ABNORMAL LOW (ref >60)
Glucose, Bld: 165 mg/dL — ABNORMAL HIGH (ref 70–99)
Potassium: 4.5 mmol/L (ref 3.5–5.1)
Sodium: 139 mmol/L (ref 135–145)
Total Bilirubin: 1.3 mg/dL — ABNORMAL HIGH (ref 0.3–1.2)
Total Protein: 8.5 g/dL — ABNORMAL HIGH (ref 6.5–8.1)

## 2020-02-09 LAB — CBC WITH DIFFERENTIAL/PLATELET
Abs Immature Granulocytes: 0.1 10*3/uL — ABNORMAL HIGH (ref 0.00–0.07)
Basophils Absolute: 0 10*3/uL (ref 0.0–0.1)
Basophils Relative: 0 %
Eosinophils Absolute: 0 10*3/uL (ref 0.0–0.5)
Eosinophils Relative: 0 %
HCT: 48.7 % — ABNORMAL HIGH (ref 36.0–46.0)
Hemoglobin: 15.5 g/dL — ABNORMAL HIGH (ref 12.0–15.0)
Immature Granulocytes: 1 %
Lymphocytes Relative: 10 %
Lymphs Abs: 1.1 10*3/uL (ref 0.7–4.0)
MCH: 28.7 pg (ref 26.0–34.0)
MCHC: 31.8 g/dL (ref 30.0–36.0)
MCV: 90.2 fL (ref 80.0–100.0)
Monocytes Absolute: 0.8 10*3/uL (ref 0.1–1.0)
Monocytes Relative: 7 %
Neutro Abs: 9.5 10*3/uL — ABNORMAL HIGH (ref 1.7–7.7)
Neutrophils Relative %: 82 %
Platelets: 183 10*3/uL (ref 150–400)
RBC: 5.4 MIL/uL — ABNORMAL HIGH (ref 3.87–5.11)
RDW: 16.5 % — ABNORMAL HIGH (ref 11.5–15.5)
WBC: 11.6 10*3/uL — ABNORMAL HIGH (ref 4.0–10.5)
nRBC: 0 % (ref 0.0–0.2)

## 2020-02-09 LAB — RESPIRATORY PANEL BY RT PCR (FLU A&B, COVID)
Influenza A by PCR: NEGATIVE
Influenza B by PCR: NEGATIVE
SARS Coronavirus 2 by RT PCR: NEGATIVE

## 2020-02-09 LAB — LIPASE, BLOOD: Lipase: 48 U/L (ref 11–51)

## 2020-02-09 LAB — LACTIC ACID, PLASMA
Lactic Acid, Venous: 7.3 mmol/L (ref 0.5–1.9)
Lactic Acid, Venous: 5.7 mmol/L (ref 0.5–1.9)

## 2020-02-09 MED ORDER — SODIUM CHLORIDE 0.9 % IV BOLUS
1000.0000 mL | Freq: Once | INTRAVENOUS | Status: AC
  Administered 2020-02-09: 20:00:00 1000 mL via INTRAVENOUS

## 2020-02-09 MED ORDER — SODIUM CHLORIDE 0.9 % IV BOLUS
1000.0000 mL | Freq: Once | INTRAVENOUS | Status: AC
  Administered 2020-02-09: 1000 mL via INTRAVENOUS

## 2020-02-09 MED ORDER — METOPROLOL TARTRATE 5 MG/5ML IV SOLN
2.5000 mg | Freq: Three times a day (TID) | INTRAVENOUS | Status: DC
  Administered 2020-02-10: 2.5 mg via INTRAVENOUS
  Filled 2020-02-09: qty 5

## 2020-02-09 MED ORDER — ACETAMINOPHEN 325 MG PO TABS: 650.0000 mg | ORAL_TABLET | Freq: Four times a day (QID) | ORAL | Status: DC | PRN

## 2020-02-09 MED ORDER — ONDANSETRON HCL 4 MG/2ML IJ SOLN
4.0000 mg | Freq: Once | INTRAMUSCULAR | Status: AC
  Administered 2020-02-09: 4 mg via INTRAVENOUS
  Filled 2020-02-09: qty 2

## 2020-02-09 MED ORDER — ACETAMINOPHEN 325 MG PO TABS
650.0000 mg | ORAL_TABLET | Freq: Once | ORAL | Status: AC
  Administered 2020-02-09: 650 mg via ORAL
  Filled 2020-02-09: qty 2

## 2020-02-09 MED ORDER — SODIUM CHLORIDE 0.9 % IV SOLN: INTRAVENOUS | Status: DC

## 2020-02-09 MED ORDER — ONDANSETRON HCL 4 MG PO TABS: 4.0000 mg | ORAL_TABLET | Freq: Four times a day (QID) | ORAL | Status: DC | PRN

## 2020-02-09 MED ORDER — HEPARIN (PORCINE) 25000 UT/250ML-% IV SOLN
500.0000 [IU]/h | INTRAVENOUS | Status: DC
  Administered 2020-02-10 (×2): 550 [IU]/h via INTRAVENOUS
  Filled 2020-02-09 (×2): qty 250

## 2020-02-09 MED ORDER — DILTIAZEM HCL-DEXTROSE 125-5 MG/125ML-% IV SOLN (PREMIX)
5.0000 mg/h | INTRAVENOUS | Status: DC
  Administered 2020-02-09: 5 mg/h via INTRAVENOUS
  Administered 2020-02-10: 12.5 mg/h via INTRAVENOUS
  Filled 2020-02-09 (×3): qty 125

## 2020-02-09 MED ORDER — ACETAMINOPHEN 650 MG RE SUPP: 650.0000 mg | Freq: Four times a day (QID) | RECTAL | Status: DC | PRN

## 2020-02-09 MED ORDER — ONDANSETRON HCL 4 MG/2ML IJ SOLN: 4.0000 mg | Freq: Four times a day (QID) | INTRAMUSCULAR | Status: DC | PRN

## 2020-02-09 MED ORDER — FENTANYL CITRATE (PF) 100 MCG/2ML IJ SOLN
25.0000 ug | Freq: Once | INTRAMUSCULAR | Status: AC
  Administered 2020-02-09: 25 ug via INTRAVENOUS
  Filled 2020-02-09: qty 2

## 2020-02-09 MED ORDER — METOPROLOL SUCCINATE ER 25 MG PO TB24
25.0000 mg | ORAL_TABLET | Freq: Every day | ORAL | Status: DC
  Administered 2020-02-09: 25 mg via ORAL
  Filled 2020-02-09: qty 1

## 2020-02-09 NOTE — H&P (Signed)
TRH H&P    Patient Demographics:    Beth Terry, is a 86 y.o. female  MRN: [id number]  DOB - 1927-02-06  Admit Date - 02/09/2020  Referring MD/NP/PA: Gerhard Munch  Outpatient Primary MD for the patient is Avon Gully, MD  Patient coming from: Home  Chief complaint-generalized weakness   HPI:    Beth Terry  is a 86 y.o. female, with history of hyperlipidemia, hypertension, atrial flutter on anticoagulation who presented to ED with complaints of generalized weakness and fatigue.  As per family patient had not been eating and drinking well for past 4 to 5 days.  Also complained of pain to the right side of her abdomen.  She had a episode of vomiting yesterday but no diarrhea.  No history of fever, cough chest pain or shortness of breath. At this time patient denies any pain, she did receive 1 dose of IV fentanyl in the ED  In the ED, CT scan of the abdomen and pelvis was done which showed small bowel obstruction.  With transition point in the terminal ileal region.  Also patient found to be in A. fib with RVR, started on IV Cardizem.    Review of systems:    In addition to the HPI above,    All other systems reviewed and are negative.    Past History of the following :    Past Medical History:  Diagnosis Date  . Hyperlipidemia   . Hypertension       Past Surgical History:  Procedure Laterality Date  . ABDOMINAL HYSTERECTOMY        Social History:      Social History   Tobacco Use  . Smoking status: Never Smoker  . Smokeless tobacco: Never Used  Substance Use Topics  . Alcohol use: Never       Family History :   Family history is unremarkable   Home Medications:   Prior to Admission medications   Medication Sig Start Date End Date Taking? Authorizing Provider  acetaminophen (TYLENOL) 500 MG tablet Take 500 mg by mouth every 6 (six) hours as needed for mild  pain or moderate pain.   Yes [provider]  apixaban (ELIQUIS) 2.5 MG TABS tablet Take 1 tablet (2.5 mg total) by mouth 2 (two) times daily. 11/23/19  Yes Laqueta Linden, MD  benazepril-hydrochlorthiazide (LOTENSIN HCT) 20-25 MG tablet Take 1 tablet by mouth daily.  06/08/19  Yes [provider]  donepezil (ARICEPT) 5 MG tablet Take 5 mg by mouth daily. 11/19/19  Yes [provider]  metoprolol succinate (TOPROL XL) 25 MG 24 hr tablet Take 1 tablet (25 mg total) by mouth 2 (two) times daily. Patient taking differently: Take 25 mg by mouth in the morning and at bedtime.  01/04/20  Yes Laqueta Linden, MD  rosuvastatin (CRESTOR) 5 MG tablet Take 5 mg by mouth daily.  06/08/19  Yes [provider]     Allergies:    No Known Allergies   Physical Exam:   Vitals  Blood pressure 134/89,  pulse (!) 128, temperature 99.4 F (37.4 C), temperature source Rectal, resp. rate (!) 27, weight 40.8 kg, SpO2 94 %.  1.  General: Appears in no acute distress  2. Psychiatric: Alert, oriented x4, intact insight and judgment  3. Neurologic: Cranial nerves II through XII grossly intact, no focal deficit noted  4. HEENMT:  Atraumatic normocephalic, extraocular muscles are intact  5. Respiratory : Clear to auscultation bilaterally, no wheezing or crackles auscultated  6. Cardiovascular : S1-S2, regular, no murmur auscultated  7. Gastrointestinal:  Abdomen is soft, nontender, no organomegaly, bowel sounds high-pitched     Data Review:    CBC Recent Labs  Lab 02/09/20 1907  WBC 11.6*  HGB 15.5*  HCT 48.7*  PLT 183  MCV 90.2  MCH 28.7  MCHC 31.8  RDW 16.5*  LYMPHSABS 1.1  MONOABS 0.8  EOSABS 0.0  BASOSABS 0.0   ------------------------------------------------------------------------------------------------------------------  Results for orders placed or performed during the hospital encounter of 02/09/20 (from the past 48 hour(s))    Urinalysis, Routine w reflex microscopic     Status: Abnormal   Collection Time: 02/09/20  6:42 PM  Result Value Ref Range   Color, Urine YELLOW YELLOW   APPearance CLOUDY (A) CLEAR   Specific Gravity, Urine 1.022 1.005 - 1.030   pH 5.0 5.0 - 8.0   Glucose, UA NEGATIVE NEGATIVE mg/dL   Hgb urine dipstick SMALL (A) NEGATIVE   Bilirubin Urine NEGATIVE NEGATIVE   Ketones, ur NEGATIVE NEGATIVE mg/dL   Protein, ur >=454>=300 (A) NEGATIVE mg/dL   Nitrite NEGATIVE NEGATIVE   Leukocytes,Ua NEGATIVE NEGATIVE   RBC / HPF 6-10 0 - 5 RBC/hpf   WBC, UA 6-10 0 - 5 WBC/hpf   Bacteria, UA RARE (A) NONE SEEN   Squamous Epithelial / LPF 0-5 0 - 5   Mucus PRESENT    Hyaline Casts, UA PRESENT     Comment: Performed at [facility], [address] [place], [place] [address]  CBC with Differential     Status: Abnormal   Collection Time: 02/09/20  7:07 PM  Result Value Ref Range   WBC 11.6 (H) 4.0 - 10.5 K/uL   RBC 5.40 (H) 3.87 - 5.11 MIL/uL   Hemoglobin 15.5 (H) 12.0 - 15.0 g/dL   HCT 91.448.7 (H) 78.236.0 - 95.646.0 %   MCV 90.2 80.0 - 100.0 fL   MCH 28.7 26.0 - 34.0 pg   MCHC 31.8 30.0 - 36.0 g/dL   RDW 21.316.5 (H) 08.611.5 - 57.815.5 %   Platelets 183 150 - 400 K/uL   nRBC 0.0 0.0 - 0.2 %   Neutrophils Relative % 82 %   Neutro Abs 9.5 (H) 1.7 - 7.7 K/uL   Lymphocytes Relative 10 %   Lymphs Abs 1.1 0.7 - 4.0 K/uL   Monocytes Relative 7 %   Monocytes Absolute 0.8 0.1 - 1.0 K/uL   Eosinophils Relative 0 %   Eosinophils Absolute 0.0 0.0 - 0.5 K/uL   Basophils Relative 0 %   Basophils Absolute 0.0 0.0 - 0.1 K/uL   Immature Granulocytes 1 %   Abs Immature Granulocytes 0.10 (H) 0.00 - 0.07 K/uL    Comment: Performed at [facility], [address] [place], [place] [address]  Comprehensive metabolic panel     Status: Abnormal   Collection Time: 02/09/20  7:07 PM  Result Value Ref Range   Sodium 139 135 - 145 mmol/L   Potassium 4.5 3.5 - 5.1 mmol/L   Chloride 95 (L) 98 -  111 mmol/L   CO2 24 22 - 32 mmol/L    Glucose, Bld 165 (H) 70 - 99 mg/dL    Comment: Glucose reference range applies only to samples taken after fasting for at least 8 hours.   BUN 59 (H) 8 - 23 mg/dL   Creatinine, Ser 4.16 (H) 0.44 - 1.00 mg/dL   Calcium 9.5 8.9 - 38.4 mg/dL   Total Protein 8.5 (H) 6.5 - 8.1 g/dL   Albumin 4.2 3.5 - 5.0 g/dL   AST 33 15 - 41 U/L   ALT <5 0 - 44 U/L   Alkaline Phosphatase 48 38 - 126 U/L   Total Bilirubin 1.3 (H) 0.3 - 1.2 mg/dL   GFR calc non Af Amer 19 (L) >60 mL/min   GFR calc Af Amer 22 (L) >60 mL/min   Anion gap 20 (H) 5 - 15    Comment: Performed at [facility], [address], [place], [place] [address]  Lipase, blood     Status: None   Collection Time: 02/09/20  7:07 PM  Result Value Ref Range   Lipase 48 11 - 51 U/L    Comment: Performed at [facility], [address], [place], [place] [address]  Lactic acid, plasma     Status: Abnormal   Collection Time: 02/09/20  7:07 PM  Result Value Ref Range   Lactic Acid, Venous 7.3 (HH) 0.5 - 1.9 mmol/L    Comment: CRITICAL RESULT CALLED TO, READ BACK BY AND VERIFIED WITH: WESTON,L ON 02/09/20 AT 2110 BY LOY,C Performed at [facility], [address] [place], [place] [address]   Blood culture (routine x 2)     Status: None (Preliminary result)   Collection Time: 02/09/20  9:24 PM   Specimen: Vein; Blood  Result Value Ref Range   Specimen Description BLOOD LEFT ARM    Special Requests      BOTTLES DRAWN AEROBIC AND ANAEROBIC Blood Culture adequate volume Performed at [facility], [address] [place], [place] [address]    Culture PENDING    Report Status PENDING   Lactic acid, plasma     Status: Abnormal   Collection Time: 02/09/20  9:27 PM  Result Value Ref Range   Lactic Acid, Venous 5.7 (HH) 0.5 - 1.9 mmol/L    Comment: CRITICAL VALUE NOTED.  VALUE IS CONSISTENT WITH PREVIOUSLY REPORTED AND CALLED VALUE. Performed at [facility], [address], [place], [place] [address]   Blood culture (routine x 2)     Status: None  (Preliminary result)   Collection Time: 02/09/20  9:27 PM   Specimen: Vein; Blood  Result Value Ref Range   Specimen Description BLOOD LEFT HAND    Special Requests      BOTTLES DRAWN AEROBIC AND ANAEROBIC Blood Culture adequate volume Performed at [facility], [address], [place], [place] [address]    Culture PENDING    Report Status PENDING   Respiratory Panel by RT PCR (Flu A&B, Covid) - Nasopharyngeal Swab     Status: None   Collection Time: 02/09/20  9:29 PM   Specimen: Nasopharyngeal Swab  Result Value Ref Range   SARS Coronavirus 2 by RT PCR NEGATIVE NEGATIVE    Comment: (NOTE) SARS-CoV-2 target nucleic acids are NOT DETECTED. The SARS-CoV-2 RNA is generally detectable in upper respiratoy specimens during the acute phase of infection. The lowest concentration of SARS-CoV-2 viral copies this assay can detect is 131 copies/mL. A negative result does not preclude SARS-Cov-2 infection and  should not be used as the sole basis for treatment or other patient management decisions. A negative result may occur with  improper specimen collection/handling, submission of specimen other than nasopharyngeal swab, presence of viral mutation(s) within the areas targeted by this assay, and inadequate number of viral copies (<131 copies/mL). A negative result must be combined with clinical observations, patient history, and epidemiological information. The expected result is Negative. Fact Sheet for Patients:  PinkCheek.be Fact Sheet for Healthcare Providers:  GravelBags.it This test is not yet ap proved or cleared by the [place] FDA and  has been authorized for detection and/or diagnosis of SARS-CoV-2 by FDA under an Emergency Use Authorization (EUA). This EUA will remain  in effect (meaning this test can be used) for the duration of the COVID-19 declaration under Section 564(b)(1) of the Act, 21 U.S.C. section  360bbb-3(b)(1), unless the authorization is terminated or revoked sooner.    Influenza A by PCR NEGATIVE NEGATIVE   Influenza B by PCR NEGATIVE NEGATIVE    Comment: (NOTE) The Xpert Xpress SARS-CoV-2/FLU/RSV assay is intended as an aid in  the diagnosis of influenza from Nasopharyngeal swab specimens and  should not be used as a sole basis for treatment. Nasal washings and  aspirates are unacceptable for Xpert Xpress SARS-CoV-2/FLU/RSV  testing. Fact Sheet for Patients: PinkCheek.be Fact Sheet for Healthcare Providers: GravelBags.it This test is not yet approved or cleared by the [place] FDA and  has been authorized for detection and/or diagnosis of SARS-CoV-2 by  FDA under an Emergency Use Authorization (EUA). This EUA will remain  in effect (meaning this test can be used) for the duration of the  Covid-19 declaration under Section 564(b)(1) of the Act, 21  U.S.C. section 360bbb-3(b)(1), unless the authorization is  terminated or revoked. Performed at [facility], [address] [place], [place] [address]     Chemistries  Recent Labs  Lab 02/09/20 1907  NA 139  K 4.5  CL 95*  CO2 24  GLUCOSE 165*  BUN 59*  CREATININE 2.19*  CALCIUM 9.5  AST 33  ALT <5  ALKPHOS 48  BILITOT 1.3*   ------------------------------------------------------------------------------------------------------------------  ------------------------------------------------------------------------------------------------------------------ GFR: Estimated Creatinine Clearance: 10.8 mL/min (A) (by C-G formula based on SCr of 2.19 mg/dL (H)). Liver Function Tests: Recent Labs  Lab 02/09/20 1907  AST 33  ALT <5  ALKPHOS 48  BILITOT 1.3*  PROT 8.5*  ALBUMIN 4.2   Recent Labs  Lab 02/09/20 1907  LIPASE 48   No results for input(s): AMMONIA in the last 168 hours. Coagulation Profile: No results for input(s): INR, PROTIME in the  last 168 hours. Cardiac Enzymes: No results for input(s): CKTOTAL, CKMB, CKMBINDEX, TROPONINI in the last 168 hours. BNP (last 3 results) No results for input(s): PROBNP in the last 8760 hours. HbA1C: No results for input(s): HGBA1C in the last 72 hours. CBG: No results for input(s): GLUCAP in the last 168 hours. Lipid Profile: No results for input(s): CHOL, HDL, LDLCALC, TRIG, CHOLHDL, LDLDIRECT in the last 72 hours. Thyroid Function Tests: No results for input(s): TSH, T4TOTAL, FREET4, T3FREE, THYROIDAB in the last 72 hours. Anemia Panel: No results for input(s): VITAMINB12, FOLATE, FERRITIN, TIBC, IRON, RETICCTPCT in the last 72 hours.  --------------------------------------------------------------------------------------------------------------- Urine analysis:    Component Value Date/Time   COLORURINE YELLOW 02/09/2020 1842   APPEARANCEUR CLOUDY (A) 02/09/2020 1842   LABSPEC 1.022 02/09/2020 1842   PHURINE 5.0 02/09/2020 1842   GLUCOSEU NEGATIVE 02/09/2020 1842   HGBUR SMALL (A) 02/09/2020  1842   BILIRUBINUR NEGATIVE 02/09/2020 1842   KETONESUR NEGATIVE 02/09/2020 1842   PROTEINUR >=300 (A) 02/09/2020 1842   NITRITE NEGATIVE 02/09/2020 1842   LEUKOCYTESUR NEGATIVE 02/09/2020 1842      Imaging Results:    CT ABDOMEN PELVIS WO CONTRAST  Result Date: 02/09/2020 CLINICAL DATA:  Weak and lethargic right-sided pain EXAM: CT ABDOMEN AND PELVIS WITHOUT CONTRAST TECHNIQUE: Multidetector CT imaging of the abdomen and pelvis was performed following the standard protocol without IV contrast. COMPARISON:  Ultrasound 09/02/2017 FINDINGS: Lower chest: Lung bases demonstrate no acute consolidation or pleural effusion. Mild bronchiectasis in the lower lobes. Mild cardiomegaly. Hepatobiliary: No focal hepatic abnormality. No calcified gallstone or biliary dilatation Pancreas: Unremarkable. No pancreatic ductal dilatation or surrounding inflammatory changes. Spleen: Normal in size without  focal abnormality. Adrenals/Urinary Tract: Adrenal glands are normal. Kidneys show no hydronephrosis. Cyst in the lower pole of the right kidney measuring 3.6 cm. The bladder is unremarkable Stomach/Bowel: Moderate fluid distension of the stomach. Diffuse fluid distension of the small bowel measuring up to 3.3 cm. Fecalized segment of distal small bowel in the pelvis. Nondistended colon filled with stool. Suspected transition point in the pelvis slightly to the right of midline, though limited details due to absence of contrast, lack of intra-abdominal fat, and the presence of ascites. No bowel wall thickening. No intramural air. Negative appendix. Vascular/Lymphatic: Extensive aortic atherosclerosis. No aneurysm. No suspicious adenopathy Reproductive: Status post hysterectomy.  No adnexal mass. Other: No free air. Small amount of ascites within the abdomen and pelvis. Slight increase densities within posterior pelvic ascites fluid. Musculoskeletal: Degenerative changes with grade 1 anterolisthesis L5 on S1. No acute osseous abnormality. IMPRESSION: 1. Findings consistent with mechanical small bowel obstruction. Suspect that the transition point is in the terminal ileal region, slightly to the right of midline in the pelvis though details are limited by the absence of enteral contrast, paucity of fat in the region and presence of ascites. 2. Small amount of abdominal and pelvic ascites. Small amount high density ascites in the posterior pelvis, could be hemorrhagic or proteinaceous. Electronically Signed   By: Jasmine Pang M.D.   On: 02/09/2020 21:04   DG Chest Portable 1 View  Result Date: 02/09/2020 CLINICAL DATA:  Weakness EXAM: PORTABLE CHEST 1 VIEW COMPARISON:  11/19/2019 FINDINGS: Two small nodular opacities overlying the right mid lung. No new consolidation or edema. No pleural effusion or pneumothorax. Stable cardiomediastinal contours with normal heart size IMPRESSION: Two small nodular opacities  overlying the right mid lung. Chest CT could be considered as indicated in this patient. Electronically Signed   By: Guadlupe Spanish M.D.   On: 02/09/2020 19:24    My personal review of EKG: Rhythm atrial fibrillation   Assessment & Plan:    Active Problems:   SBO (small bowel obstruction) (HCC)   1. Atrial fibrillation with RVR-patient found to be in A. fib with RVR, started on IV Cardizem in the ED.  We will continue with Cardizem gtt.  Patient takes Eliquis at home.  Will hold p.o. Eliquis and start IV heparin per pharmacy consultation. 2. Small bowel obstruction-CT abdomen pelvis reveals small bowel obstruction with transition point at terminal ileum.  We will keep patient n.p.o., NG tube has been ordered in the ED.  Will obtain surgical consultation in a.m.  Start IV normal saline at 100 mL/h. 3. Lactic acidosis-patient found to have significant lactic acidosis of 7.3 with high anion gap of 20.  Patient does not have diabetes mellitus.  Blood glucose is stable.  Lactic acidosis likely from hypotension, dehydration.  Patient was hypotensive at the time of presentation in the ED.  Repeat lactic acid is down to 5.7.  Will follow lactic acid every 4 hours x2. 4. Acute kidney injury-patient baseline creatinine is 1.10, now presenting with creatinine of 2.19.  Likely prerenal azotemia from poor p.o. intake and dehydration.  Will start IV normal saline as above.  Follow renal function in a.m. 5. History of dementia-no behavior disturbance.  P.o. medications on hold as patient is currently n.p.o.  Will monitor closely. 6. History of hypertension-hold benazepril/HCTZ.  Will start metoprolol 2.5 mg IV every 8 hours.    DVT Prophylaxis-   Heparin  AM Labs Ordered, also please review Full Orders  Family Communication: Admission, patients condition and plan of care including tests being ordered have been discussed with the patient  who indicate understanding and agree with the plan and Code  Status.  Code Status: Full code  Admission status: Inpatient :The appropriate admission status for this patient is INPATIENT. Inpatient status is judged to be reasonable and necessary in order to provide the required intensity of service to ensure the patient's safety. The patient's presenting symptoms, physical exam findings, and initial radiographic and laboratory data in the context of their chronic comorbidities is felt to place them at high risk for further clinical deterioration. Furthermore, it is not anticipated that the patient will be medically stable for discharge from the hospital within 2 midnights of admission. The following factors support the admission status of inpatient.     The patient's presenting symptoms include generalized weakness. The worrisome physical exam findings include none. The initial radiographic and laboratory data are worrisome because of small bowel obstruction. The chronic co-morbidities include dementia.       * I certify that at the point of admission it is my clinical judgment that the patient will require inpatient hospital care spanning beyond 2 midnights from the point of admission due to high intensity of service, high risk for further deterioration and high frequency of surveillance required.*  Status is: Inpatient    Dispo: The patient is from: Home              Anticipated d/c is to: Home              Anticipated d/c date is:2020-03-07              Patient currently medically stable       Time spent in minutes : 60 min   Kyson Kupper S Yitzchak Kothari M.D

## 2020-02-09 NOTE — ED Notes (Signed)
Date and time results received: 02/09/20 2107 (use smartphrase ".now" to insert current time)  Test: lactic acid  Critical Value: 7.4  Name of Provider Notified: Couture,PA  Orders Received? Or Actions Taken?: will place orders

## 2020-02-09 NOTE — Progress Notes (Addendum)
ANTICOAGULATION CONSULT NOTE - Initial Consult  Pharmacy Consult for Heparin Indication: atrial flutter  No Known Allergies  Patient Measurements: Weight: 40.8 kg (89 lb 15.2 oz) Heparin Dosing Weight: 40.8 kg  Vital Signs: Temp: 99.4 F (37.4 C) (04/28 1831) Temp Source: Rectal (04/28 1831) BP: 103/66 (04/28 2300) Pulse Rate: 105 (04/28 2300)  Labs: Recent Labs    02/09/20 1907  HGB 15.5*  HCT 48.7*  PLT 183  CREATININE 2.19*    Estimated Creatinine Clearance: 10.8 mL/min (A) (by C-G formula based on SCr of 2.19 mg/dL (H)).   Medical History: Past Medical History:  Diagnosis Date  . Hyperlipidemia   . Hypertension     Medications:  (Not in a hospital admission)   Assessment: 86 y/o F with a h/o atrial flutter on apixaban with last dose 4/27 (time unknown) admitted with generalized weakness. Pharmacy consulted to initiate heparin infusion while holding apixaban.   Goal of Therapy:  Heparin level 0.3-0.7 units/ml APTT 66-102 sec Monitor platelets by anticoagulation protocol: Yes   Plan:  Heparin infusion at 550 units/hr APTT in 8 hours Daily CBC/aPTT/HL until aPTT and HL correlate Monitor for s/s of bleeding  Valentina Gu 02/09/2020,11:47 PM

## 2020-02-09 NOTE — ED Notes (Signed)
PA at bedside.

## 2020-02-09 NOTE — ED Provider Notes (Signed)
[facility] EMERGENCY DEPARTMENT Provider Note   CSN: [id number] Arrival date & time: 02/09/20  1746     History Chief Complaint  Patient presents with  . Weakness    Beth Terry is a 86 y.o. female.  HPI   Patient is a 86 year old female with history of hyperlipidemia, hypertension, atrial flutter (on anticoagulation) who presents to the emergency department today for evaluation of generalized weakness and fatigue.  Family is at bedside and assist with the history.  They state that the patient has not really been eating or drinking for the last 4 to 5 days.  She has also been complaining of pain to the right side of her abdomen.  She had an episode of vomiting yesterday.  She has not had any diarrhea.  She has not had any fevers that they are aware of. Pt denies cough, chest pain, shortness of breath, or dysuria  Past Medical History:  Diagnosis Date  . Hyperlipidemia   . Hypertension     There are no problems to display for this patient.   Past Surgical History:  Procedure Laterality Date  . ABDOMINAL HYSTERECTOMY       OB History   No obstetric history on file.     No family history on file.  Social History   Tobacco Use  . Smoking status: Never Smoker  . Smokeless tobacco: Never Used  Substance Use Topics  . Alcohol use: Never  . Drug use: Never    Home Medications Prior to Admission medications   Medication Sig Start Date End Date Taking? Authorizing Provider  acetaminophen (TYLENOL) 500 MG tablet Take 500 mg by mouth every 6 (six) hours as needed for mild pain or moderate pain.   Yes [provider]  apixaban (ELIQUIS) 2.5 MG TABS tablet Take 1 tablet (2.5 mg total) by mouth 2 (two) times daily. 11/23/19  Yes Laqueta Linden, MD  benazepril-hydrochlorthiazide (LOTENSIN HCT) 20-25 MG tablet Take 1 tablet by mouth daily.  06/08/19  Yes [provider]  donepezil (ARICEPT) 5 MG tablet Take 5 mg by mouth daily. 11/19/19  Yes  [provider]  metoprolol succinate (TOPROL XL) 25 MG 24 hr tablet Take 1 tablet (25 mg total) by mouth 2 (two) times daily. Patient taking differently: Take 25 mg by mouth in the morning and at bedtime.  01/04/20  Yes Laqueta Linden, MD  rosuvastatin (CRESTOR) 5 MG tablet Take 5 mg by mouth daily.  06/08/19  Yes [provider]    Allergies    Patient has no known allergies.  Review of Systems   Review of Systems  Constitutional: Negative for fever.  HENT: Negative for ear pain and sore throat.   Eyes: Negative for visual disturbance.  Respiratory: Negative for cough and shortness of breath.   Cardiovascular: Negative for chest pain.  Gastrointestinal: Positive for abdominal pain, nausea and vomiting. Negative for constipation and diarrhea.  Genitourinary: Negative for dysuria and hematuria.  Musculoskeletal: Negative for back pain.  Skin: Negative for color change and rash.  Neurological: Positive for weakness (generalized). Negative for headaches.  All other systems reviewed and are negative.   Physical Exam Updated Vital Signs BP (!) 155/125   Pulse (!) 129   Temp 99.4 F (37.4 C) (Rectal)   Resp (!) 28   Wt 40.8 kg   SpO2 98%   BMI 14.97 kg/m   Physical Exam Vitals and nursing note reviewed.  Constitutional:      General: She  is not in acute distress.    Appearance: She is well-developed.  HENT:     Head: Normocephalic and atraumatic.  Eyes:     Conjunctiva/sclera: Conjunctivae normal.  Cardiovascular:     Rate and Rhythm: Tachycardia present.     Heart sounds: No murmur.     Comments: Irregular rhytm Pulmonary:     Effort: Pulmonary effort is normal. No respiratory distress.     Breath sounds: Rales (rll, faint) present. No wheezing.  Abdominal:     General: Bowel sounds are normal.     Palpations: Abdomen is soft.     Tenderness: There is abdominal tenderness (rlq). There is guarding. There is no rebound.  Musculoskeletal:      Cervical back: Neck supple.  Skin:    General: Skin is warm and dry.  Neurological:     Mental Status: She is alert.     ED Results / Procedures / Treatments   Labs (all labs ordered are listed, but only abnormal results are displayed) Labs Reviewed  CBC WITH DIFFERENTIAL/PLATELET - Abnormal; Notable for the following components:      Result Value   WBC 11.6 (*)    RBC 5.40 (*)    Hemoglobin 15.5 (*)    HCT 48.7 (*)    RDW 16.5 (*)    Neutro Abs 9.5 (*)    Abs Immature Granulocytes 0.10 (*)    All other components within normal limits  COMPREHENSIVE METABOLIC PANEL - Abnormal; Notable for the following components:   Chloride 95 (*)    Glucose, Bld 165 (*)    BUN 59 (*)    Creatinine, Ser 2.19 (*)    Total Protein 8.5 (*)    Total Bilirubin 1.3 (*)    GFR calc non Af Amer 19 (*)    GFR calc Af Amer 22 (*)    Anion gap 20 (*)    All other components within normal limits  URINALYSIS, ROUTINE W REFLEX MICROSCOPIC - Abnormal; Notable for the following components:   APPearance CLOUDY (*)    Hgb urine dipstick SMALL (*)    Protein, ur >=300 (*)    Bacteria, UA RARE (*)    All other components within normal limits  LACTIC ACID, PLASMA - Abnormal; Notable for the following components:   Lactic Acid, Venous 7.3 (*)    All other components within normal limits  CULTURE, BLOOD (ROUTINE X 2)  CULTURE, BLOOD (ROUTINE X 2)  RESPIRATORY PANEL BY RT PCR (FLU A&B, COVID)  LIPASE, BLOOD  LACTIC ACID, PLASMA    EKG EKG Interpretation  Date/Time:  Wednesday February 09 2020 18:02:50 EDT Ventricular Rate:  138 PR Interval:    QRS Duration: 76 QT Interval:  287 QTC Calculation: 435 R Axis:   52 Text Interpretation: Atrial fibrillation Repolarization abnormality, prob rate related Abnormal ECG Confirmed by Gerhard Munch [phone]) on 02/09/2020 10:16:46 PM   Radiology CT ABDOMEN PELVIS WO CONTRAST  Result Date: 02/09/2020 CLINICAL DATA:  Weak and lethargic right-sided pain EXAM:  CT ABDOMEN AND PELVIS WITHOUT CONTRAST TECHNIQUE: Multidetector CT imaging of the abdomen and pelvis was performed following the standard protocol without IV contrast. COMPARISON:  Ultrasound 09/02/2017 FINDINGS: Lower chest: Lung bases demonstrate no acute consolidation or pleural effusion. Mild bronchiectasis in the lower lobes. Mild cardiomegaly. Hepatobiliary: No focal hepatic abnormality. No calcified gallstone or biliary dilatation Pancreas: Unremarkable. No pancreatic ductal dilatation or surrounding inflammatory changes. Spleen: Normal in size without focal abnormality. Adrenals/Urinary Tract: Adrenal glands are normal.  Kidneys show no hydronephrosis. Cyst in the lower pole of the right kidney measuring 3.6 cm. The bladder is unremarkable Stomach/Bowel: Moderate fluid distension of the stomach. Diffuse fluid distension of the small bowel measuring up to 3.3 cm. Fecalized segment of distal small bowel in the pelvis. Nondistended colon filled with stool. Suspected transition point in the pelvis slightly to the right of midline, though limited details due to absence of contrast, lack of intra-abdominal fat, and the presence of ascites. No bowel wall thickening. No intramural air. Negative appendix. Vascular/Lymphatic: Extensive aortic atherosclerosis. No aneurysm. No suspicious adenopathy Reproductive: Status post hysterectomy.  No adnexal mass. Other: No free air. Small amount of ascites within the abdomen and pelvis. Slight increase densities within posterior pelvic ascites fluid. Musculoskeletal: Degenerative changes with grade 1 anterolisthesis L5 on S1. No acute osseous abnormality. IMPRESSION: 1. Findings consistent with mechanical small bowel obstruction. Suspect that the transition point is in the terminal ileal region, slightly to the right of midline in the pelvis though details are limited by the absence of enteral contrast, paucity of fat in the region and presence of ascites. 2. Small amount of  abdominal and pelvic ascites. Small amount high density ascites in the posterior pelvis, could be hemorrhagic or proteinaceous. Electronically Signed   By: Jasmine Pang M.D.   On: 02/09/2020 21:04   DG Chest Portable 1 View  Result Date: 02/09/2020 CLINICAL DATA:  Weakness EXAM: PORTABLE CHEST 1 VIEW COMPARISON:  11/19/2019 FINDINGS: Two small nodular opacities overlying the right mid lung. No new consolidation or edema. No pleural effusion or pneumothorax. Stable cardiomediastinal contours with normal heart size IMPRESSION: Two small nodular opacities overlying the right mid lung. Chest CT could be considered as indicated in this patient. Electronically Signed   By: Guadlupe Spanish M.D.   On: 02/09/2020 19:24    Procedures Procedures (including critical care time)  10:19 PM Cardiac monitoring reveals aflutter, HR 130s (Rate & rhythm), as reviewed and interpreted by me. Cardiac monitoring was ordered due to tachycardia and to monitor patient for dysrhythmia.  CRITICAL CARE Performed by: Karrie Meres   Total critical care time: 31 minutes  Critical care time was exclusive of separately billable procedures and treating other patients.  Critical care was necessary to treat or prevent imminent or life-threatening deterioration.  Critical care was time spent personally by me on the following activities: development of treatment plan with patient and/or surrogate as well as nursing, discussions with consultants, evaluation of patient's response to treatment, examination of patient, obtaining history from patient or surrogate, ordering and performing treatments and interventions, ordering and review of laboratory studies, ordering and review of radiographic studies, pulse oximetry and re-evaluation of patient's condition.    Medications Ordered in ED Medications  metoprolol succinate (TOPROL-XL) 24 hr tablet 25 mg (25 mg Oral Given 02/09/20 2029)  diltiazem (CARDIZEM) 125 mg in dextrose 5%  125 mL (1 mg/mL) infusion (5 mg/hr Intravenous New Bag/Given 02/09/20 2205)  acetaminophen (TYLENOL) tablet 650 mg (650 mg Oral Given 02/09/20 1910)  ondansetron (ZOFRAN) injection 4 mg (4 mg Intravenous Given 02/09/20 1910)  sodium chloride 0.9 % bolus 1,000 mL (0 mLs Intravenous Stopped 02/09/20 2028)  sodium chloride 0.9 % bolus 1,000 mL (0 mLs Intravenous Stopped 02/09/20 2145)  fentaNYL (SUBLIMAZE) injection 25 mcg (25 mcg Intravenous Given 02/09/20 2145)    ED Course  I have reviewed the triage vital signs and the nursing notes.  Pertinent labs & imaging results that were available during my  care of the patient were reviewed by me and considered in my medical decision making (see chart for details).    MDM Rules/Calculators/A&P                      86 year old female presenting for evaluation of decreased p.o. intake, generalized weakness for the last 4 to 5 days.  Also complaining of right lower quadrant abdominal pain.  On arrival patient's heart rate elevated in the 130s.  She is afebrile.  She is somewhat tachypneic.  She had an episode of hypotension that quickly resolved.  Will order labs, EKG, chest x-ray, CT abdomen/pelvis.  We will also initiate fluid resuscitation and reassess heart rate.  Reviewed/interpreted labs CBC without leukocytosis.  Hemoglobin elevated at 15.5 likely due to hemoconcentration from dehydration CMP shows elevated BUN/creatinine at 5 9/2.19 which is much worse than prior labs 2 months ago. Lipase is negative Lactic acid elevated at 7.5 UA pending on admission  EKG with afib with rvr  Chest x-ray reviewed/interpreted - two small nodular opacities overlying the right mid lung. No obvious pna, ptx  CT abd/pelvis personally reviewed/interpreted   Pt was given IVF, tylenol, zofran and her home dose of metolprolol which she had not taken today.   10:55 PM Rechecked pt. BP 101 systolic. HR continues to be elevated in the 120s-140s, will start dilt  infusion.   Will admit to hospitalist service for further tx of bowel obstruction and afib with RVR  10:19 CONSULT with Dr. Darrick Meigs with hospitalist service who accepts patient for admission   Final Clinical Impression(s) / ED Diagnoses Final diagnoses:  Intestinal obstruction, unspecified cause, unspecified whether partial or complete [facility])  Atrial fibrillation, unspecified type [facility])    Rx / DC Orders ED Discharge Orders    None       Bishop Dublin 02/09/20 2219    Carmin Muskrat, MD 02/10/20 [phone]

## 2020-02-09 NOTE — ED Triage Notes (Signed)
EMS reports family called because pt has been weak and lethargic x 5 days.  Denies any pain with ems  but told ems she started feeling "bad" around 1030 this morning.  HR 130-160 with ems, bp 97/74.  REports has been c/o of r sided abd pain and strong smelling urine.

## 2020-02-10 ENCOUNTER — Inpatient Hospital Stay (HOSPITAL_COMMUNITY): Payer: Medicare HMO

## 2020-02-10 ENCOUNTER — Encounter (HOSPITAL_COMMUNITY): Payer: Self-pay | Admitting: Family Medicine

## 2020-02-10 DIAGNOSIS — E872 Acidosis, unspecified: Secondary | ICD-10-CM | POA: Diagnosis present

## 2020-02-10 DIAGNOSIS — I4892 Unspecified atrial flutter: Secondary | ICD-10-CM

## 2020-02-10 DIAGNOSIS — I10 Essential (primary) hypertension: Secondary | ICD-10-CM | POA: Diagnosis present

## 2020-02-10 DIAGNOSIS — N179 Acute kidney failure, unspecified: Secondary | ICD-10-CM

## 2020-02-10 DIAGNOSIS — E785 Hyperlipidemia, unspecified: Secondary | ICD-10-CM | POA: Diagnosis present

## 2020-02-10 DIAGNOSIS — Z01810 Encounter for preprocedural cardiovascular examination: Secondary | ICD-10-CM

## 2020-02-10 DIAGNOSIS — Z515 Encounter for palliative care: Secondary | ICD-10-CM

## 2020-02-10 DIAGNOSIS — I5042 Chronic combined systolic (congestive) and diastolic (congestive) heart failure: Secondary | ICD-10-CM

## 2020-02-10 DIAGNOSIS — K56609 Unspecified intestinal obstruction, unspecified as to partial versus complete obstruction: Secondary | ICD-10-CM

## 2020-02-10 DIAGNOSIS — E860 Dehydration: Secondary | ICD-10-CM | POA: Diagnosis present

## 2020-02-10 DIAGNOSIS — Z7189 Other specified counseling: Secondary | ICD-10-CM

## 2020-02-10 DIAGNOSIS — D689 Coagulation defect, unspecified: Secondary | ICD-10-CM | POA: Diagnosis present

## 2020-02-10 LAB — CBC
HCT: 42.6 % (ref 36.0–46.0)
Hemoglobin: 13.7 g/dL (ref 12.0–15.0)
MCH: 29.3 pg (ref 26.0–34.0)
MCHC: 32.2 g/dL (ref 30.0–36.0)
MCV: 91 fL (ref 80.0–100.0)
Platelets: 164 10*3/uL (ref 150–400)
RBC: 4.68 MIL/uL (ref 3.87–5.11)
RDW: 16.3 % — ABNORMAL HIGH (ref 11.5–15.5)
WBC: 10 10*3/uL (ref 4.0–10.5)
nRBC: 0 % (ref 0.0–0.2)

## 2020-02-10 LAB — COMPREHENSIVE METABOLIC PANEL
ALT: 20 U/L (ref 0–44)
AST: 26 U/L (ref 15–41)
Albumin: 3.5 g/dL (ref 3.5–5.0)
Alkaline Phosphatase: 41 U/L (ref 38–126)
Anion gap: 14 (ref 5–15)
BUN: 63 mg/dL — ABNORMAL HIGH (ref 8–23)
CO2: 25 mmol/L (ref 22–32)
Calcium: 8.4 mg/dL — ABNORMAL LOW (ref 8.9–10.3)
Chloride: 100 mmol/L (ref 98–111)
Creatinine, Ser: 1.96 mg/dL — ABNORMAL HIGH (ref 0.44–1.00)
GFR calc Af Amer: 25 mL/min — ABNORMAL LOW (ref >60)
GFR calc non Af Amer: 22 mL/min — ABNORMAL LOW (ref >60)
Glucose, Bld: 161 mg/dL — ABNORMAL HIGH (ref 70–99)
Potassium: 4.1 mmol/L (ref 3.5–5.1)
Sodium: 139 mmol/L (ref 135–145)
Total Bilirubin: 1.4 mg/dL — ABNORMAL HIGH (ref 0.3–1.2)
Total Protein: 6.9 g/dL (ref 6.5–8.1)

## 2020-02-10 LAB — PROTIME-INR
INR: 1.2 (ref 0.8–1.2)
Prothrombin Time: 14.4 seconds (ref 11.4–15.2)

## 2020-02-10 LAB — APTT
aPTT: 28 seconds (ref 24–36)
aPTT: 108 seconds — ABNORMAL HIGH (ref 24–36)
aPTT: 80 seconds — ABNORMAL HIGH (ref 24–36)

## 2020-02-10 LAB — LACTIC ACID, PLASMA
Lactic Acid, Venous: 4.2 mmol/L (ref 0.5–1.9)
Lactic Acid, Venous: 2.7 mmol/L (ref 0.5–1.9)

## 2020-02-10 LAB — HEPARIN LEVEL (UNFRACTIONATED): Heparin Unfractionated: 1.82 IU/mL — ABNORMAL HIGH (ref 0.30–0.70)

## 2020-02-10 LAB — MRSA PCR SCREENING: MRSA by PCR: NEGATIVE

## 2020-02-10 MED ORDER — MILK AND MOLASSES ENEMA
1.0000 | Freq: Once | RECTAL | Status: AC
  Administered 2020-02-10: 240 mL via RECTAL

## 2020-02-10 MED ORDER — HYDROMORPHONE HCL 1 MG/ML IJ SOLN
0.2500 mg | INTRAMUSCULAR | Status: DC | PRN
  Administered 2020-02-10 – 2020-02-11 (×3): 0.25 mg via INTRAVENOUS
  Filled 2020-02-10 (×3): qty 0.5

## 2020-02-10 MED ORDER — ORAL CARE MOUTH RINSE
15.0000 mL | Freq: Two times a day (BID) | OROMUCOSAL | Status: DC
  Administered 2020-02-10: 15 mL via OROMUCOSAL

## 2020-02-10 MED ORDER — METOPROLOL TARTRATE 5 MG/5ML IV SOLN
2.5000 mg | Freq: Four times a day (QID) | INTRAVENOUS | Status: DC
  Administered 2020-02-10 – 2020-02-11 (×4): 2.5 mg via INTRAVENOUS
  Filled 2020-02-10 (×3): qty 5

## 2020-02-10 MED ORDER — CHLORHEXIDINE GLUCONATE CLOTH 2 % EX PADS
6.0000 | MEDICATED_PAD | Freq: Every day | CUTANEOUS | Status: DC
  Administered 2020-02-10 – 2020-02-11 (×2): 6 via TOPICAL

## 2020-02-10 NOTE — Progress Notes (Signed)
Pt given enema per order. Pt tolerated well Hr increased 100's. Will continue to monitor.

## 2020-02-10 NOTE — Progress Notes (Signed)
PROGRESS NOTE   KY MOSKOWITZ  GYJ:[id number] DOB: December 04, 1926 DOA: 02/09/2020 PCP: Avon Gully, MD   Chief Complaint  Patient presents with  . Weakness    Brief Narrative:  86 y.o. female, with history of hyperlipidemia, hypertension, atrial flutter on anticoagulation who presented to ED with complaints of generalized weakness and fatigue and found to have a small bowel obstruction and atrial flutter with RVR.      Assessment & Plan:   Principal Problem:   SBO (small bowel obstruction) (HCC) Active Problems:   Atrial flutter with rapid ventricular response (HCC)   Lactic acidosis   Atrial flutter (HCC)   Coagulopathy (HCC)   AKI (acute kidney injury) (HCC)   Dehydration   Hypertension   Hyperlipidemia   1. Atrial flutter with rapid ventricular response-patient has responded well to IV Cardizem infusion and scheduled IV metoprolol 2.5 every 6 hours.  Continue to monitor.  She is fully anticoagulated with an IV heparin infusion.  Cardiology has seen her. 2. SBO-I spoke with surgeon Dr. Lovell Sheehan and will continue supportive therapy and monitoring no urgent need for surgical management at this time we will continue to follow closely.  Enemas ordered per surgery.  Patient appears to be high risk for surgical procedures.  I have asked for palliative medicine consultation to assess goals of care and CODE STATUS. 3. AKI-secondary to dehydration continue IV fluids and monitor BMP. 4. Coagulopathy-patient is fully anticoagulated with IV heparin infusion while NPO.  Home apixaban on hold. 5. Lactic acidosis-trending down with hydration continue supportive therapy.    DVT prophylaxis: IV heparin Code Status:  Full  Family Communication:  Disposition:   Status is: Inpatient  Remains inpatient appropriate because:Hemodynamically unstable   Dispo: The patient is from: Home              Anticipated d/c is to: Home              Anticipated d/c date is: 3 days               Patient currently is not medically stable to d/c.   Consultants:   Surgery  cardiology  Procedures:     Antimicrobials:    Subjective: Pt reports pain is controlled.  No specific complaints.   Objective: Vitals:   02/10/20 1345 02/10/20 1400 02/10/20 1415 02/10/20 1430  BP: 100/63 104/72 110/77 112/81  Pulse: (!) 103 91    Resp: 20 (!) 28 (!) 25 (!) 40  Temp:      TempSrc:      SpO2: (!) 86% 94%    Weight:        Intake/Output Summary (Last 24 hours) at 02/10/2020 1448 Last data filed at 02/10/2020 1440 Gross per 24 hour  Intake 1268.3 ml  Output 425 ml  Net 843.3 ml   Filed Weights   02/09/20 1757 02/10/20 0046  Weight: 40.8 kg 42.2 kg    Examination:  General exam: Appears calm and comfortable  Respiratory system: Clear to auscultation. Respiratory effort normal. Cardiovascular system: S1 & S2 heard, RRR. No JVD, murmurs, rubs, gallops or clicks. No pedal edema. Gastrointestinal system: Abdomen is nondistended, soft and nontender. No organomegaly or masses felt. Normal bowel sounds heard. Central nervous system: Alert and oriented. No focal neurological deficits. Extremities: Symmetric 5 x 5 power. Skin: No rashes, lesions or ulcers Psychiatry: Judgement and insight appear normal. Mood & affect appropriate.   Data Reviewed: I have personally reviewed following labs and imaging studies  CBC:  Recent Labs  Lab 02/09/20 1907 02/10/20 0257  WBC 11.6* 10.0  NEUTROABS 9.5*  --   HGB 15.5* 13.7  HCT 48.7* 42.6  MCV 90.2 91.0  PLT 183 164    Basic Metabolic Panel: Recent Labs  Lab 02/09/20 1907 02/10/20 0257  NA 139 139  K 4.5 4.1  CL 95* 100  CO2 24 25  GLUCOSE 165* 161*  BUN 59* 63*  CREATININE 2.19* 1.96*  CALCIUM 9.5 8.4*    GFR: Estimated Creatinine Clearance: 12.5 mL/min (A) (by C-G formula based on SCr of 1.96 mg/dL (H)).  Liver Function Tests: Recent Labs  Lab 02/09/20 1907 02/10/20 0257  AST 33 26  ALT <5 20  ALKPHOS 48 41    BILITOT 1.3* 1.4*  PROT 8.5* 6.9  ALBUMIN 4.2 3.5    CBG: No results for input(s): GLUCAP in the last 168 hours.   Recent Results (from the past 240 hour(s))  Blood culture (routine x 2)     Status: None (Preliminary result)   Collection Time: 02/09/20  9:24 PM   Specimen: BLOOD LEFT ARM  Result Value Ref Range Status   Specimen Description BLOOD LEFT ARM  Final   Special Requests   Final    BOTTLES DRAWN AEROBIC AND ANAEROBIC Blood Culture adequate volume   Culture   Final    NO GROWTH < 24 HOURS Performed at [facility], [address] [place], [place] [address]    Report Status PENDING  Incomplete  Blood culture (routine x 2)     Status: None (Preliminary result)   Collection Time: 02/09/20  9:27 PM   Specimen: BLOOD LEFT HAND  Result Value Ref Range Status   Specimen Description BLOOD LEFT HAND  Final   Special Requests   Final    BOTTLES DRAWN AEROBIC AND ANAEROBIC Blood Culture adequate volume   Culture   Final    NO GROWTH < 24 HOURS Performed at [facility], [address], [place], [place] [address]    Report Status PENDING  Incomplete  Respiratory Panel by RT PCR (Flu A&B, Covid) - Nasopharyngeal Swab     Status: None   Collection Time: 02/09/20  9:29 PM   Specimen: Nasopharyngeal Swab  Result Value Ref Range Status   SARS Coronavirus 2 by RT PCR NEGATIVE NEGATIVE Final    Comment: (NOTE) SARS-CoV-2 target nucleic acids are NOT DETECTED. The SARS-CoV-2 RNA is generally detectable in upper respiratoy specimens during the acute phase of infection. The lowest concentration of SARS-CoV-2 viral copies this assay can detect is 131 copies/mL. A negative result does not preclude SARS-Cov-2 infection and should not be used as the sole basis for treatment or other patient management decisions. A negative result may occur with  improper specimen collection/handling, submission of specimen other than nasopharyngeal swab, presence of viral mutation(s) within the areas  targeted by this assay, and inadequate number of viral copies (<131 copies/mL). A negative result must be combined with clinical observations, patient history, and epidemiological information. The expected result is Negative. Fact Sheet for Patients:  https://www.moore.com/ Fact Sheet for Healthcare Providers:  https://www.young.biz/ This test is not yet ap proved or cleared by the [place] FDA and  has been authorized for detection and/or diagnosis of SARS-CoV-2 by FDA under an Emergency Use Authorization (EUA). This EUA will remain  in effect (meaning this test can be used) for the duration of the COVID-19 declaration under Section 564(b)(1) of the Act, 21 U.S.C. section 360bbb-3(b)(1), unless the authorization  is terminated or revoked sooner.    Influenza A by PCR NEGATIVE NEGATIVE Final   Influenza B by PCR NEGATIVE NEGATIVE Final    Comment: (NOTE) The Xpert Xpress SARS-CoV-2/FLU/RSV assay is intended as an aid in  the diagnosis of influenza from Nasopharyngeal swab specimens and  should not be used as a sole basis for treatment. Nasal washings and  aspirates are unacceptable for Xpert Xpress SARS-CoV-2/FLU/RSV  testing. Fact Sheet for Patients: PinkCheek.be Fact Sheet for Healthcare Providers: GravelBags.it This test is not yet approved or cleared by the [place] FDA and  has been authorized for detection and/or diagnosis of SARS-CoV-2 by  FDA under an Emergency Use Authorization (EUA). This EUA will remain  in effect (meaning this test can be used) for the duration of the  Covid-19 declaration under Section 564(b)(1) of the Act, 21  U.S.C. section 360bbb-3(b)(1), unless the authorization is  terminated or revoked. Performed at [facility], [address] [place], [place] [address]   MRSA PCR Screening     Status: None   Collection Time: 02/09/20 11:53 PM   Specimen:  Nasal Mucosa; Nasopharyngeal  Result Value Ref Range Status   MRSA by PCR NEGATIVE NEGATIVE Final    Comment:        The GeneXpert MRSA Assay (FDA approved for NASAL specimens only), is one component of a comprehensive MRSA colonization surveillance program. It is not intended to diagnose MRSA infection nor to guide or monitor treatment for MRSA infections. Performed at [facility], [address], [place], [place] [address]    Radiology Studies: CT ABDOMEN PELVIS WO CONTRAST  Result Date: 02/09/2020 CLINICAL DATA:  Weak and lethargic right-sided pain EXAM: CT ABDOMEN AND PELVIS WITHOUT CONTRAST TECHNIQUE: Multidetector CT imaging of the abdomen and pelvis was performed following the standard protocol without IV contrast. COMPARISON:  Ultrasound 09/02/2017 FINDINGS: Lower chest: Lung bases demonstrate no acute consolidation or pleural effusion. Mild bronchiectasis in the lower lobes. Mild cardiomegaly. Hepatobiliary: No focal hepatic abnormality. No calcified gallstone or biliary dilatation Pancreas: Unremarkable. No pancreatic ductal dilatation or surrounding inflammatory changes. Spleen: Normal in size without focal abnormality. Adrenals/Urinary Tract: Adrenal glands are normal. Kidneys show no hydronephrosis. Cyst in the lower pole of the right kidney measuring 3.6 cm. The bladder is unremarkable Stomach/Bowel: Moderate fluid distension of the stomach. Diffuse fluid distension of the small bowel measuring up to 3.3 cm. Fecalized segment of distal small bowel in the pelvis. Nondistended colon filled with stool. Suspected transition point in the pelvis slightly to the right of midline, though limited details due to absence of contrast, lack of intra-abdominal fat, and the presence of ascites. No bowel wall thickening. No intramural air. Negative appendix. Vascular/Lymphatic: Extensive aortic atherosclerosis. No aneurysm. No suspicious adenopathy Reproductive: Status post hysterectomy.  No adnexal  mass. Other: No free air. Small amount of ascites within the abdomen and pelvis. Slight increase densities within posterior pelvic ascites fluid. Musculoskeletal: Degenerative changes with grade 1 anterolisthesis L5 on S1. No acute osseous abnormality. IMPRESSION: 1. Findings consistent with mechanical small bowel obstruction. Suspect that the transition point is in the terminal ileal region, slightly to the right of midline in the pelvis though details are limited by the absence of enteral contrast, paucity of fat in the region and presence of ascites. 2. Small amount of abdominal and pelvic ascites. Small amount high density ascites in the posterior pelvis, could be hemorrhagic or proteinaceous. Electronically Signed   By: Donavan Foil M.D.   On: 02/09/2020  21:04   DG Chest Port 1 View  Result Date: 02/10/2020 CLINICAL DATA:  NG tube repositioning EXAM: PORTABLE CHEST 1 VIEW COMPARISON:  February 09, 2020 11:44 p.m. FINDINGS: Again noted is cardiomegaly. Aortic knob calcifications. There is interval repositioning of the NG tube which is not seen below the diaphragm within the proximal stomach. There is mildly dilated air-filled loops of small bowel within the right upper quadrant. The lungs are clear. No focal airspace consolidation. No acute osseous abnormality. IMPRESSION: *Interval repositioning of the NG tube within the proximal stomach. *Mildly dilated air-filled loops of small bowel in the right upper quadrant. Electronically Signed   By: Jonna Clark M.D.   On: 02/10/2020 03:11   DG Chest Portable 1 View  Result Date: 02/09/2020 CLINICAL DATA:  Weakness EXAM: PORTABLE CHEST 1 VIEW COMPARISON:  11/19/2019 FINDINGS: Two small nodular opacities overlying the right mid lung. No new consolidation or edema. No pleural effusion or pneumothorax. Stable cardiomediastinal contours with normal heart size IMPRESSION: Two small nodular opacities overlying the right mid lung. Chest CT could be considered as  indicated in this patient. Electronically Signed   By: Guadlupe Spanish M.D.   On: 02/09/2020 19:24   DG Chest Port 1V same Day  Result Date: 02/10/2020 CLINICAL DATA:  NG tube placement. EXAM: PORTABLE CHEST 1 VIEW COMPARISON:  Chest radiograph and abdominal CT earlier this day. FINDINGS: The enteric tube is kinked at the gastroesophageal junction with the tip directed cranially in the distal esophagus. Recommend repositioning. Low lung volumes. No acute airspace disease. Gaseous distention of bowel in the upper abdomen. IMPRESSION: The enteric tube is kinked at the gastroesophageal junction with the tip directed cranially in the distal esophagus. Recommend repositioning. Electronically Signed   By: Narda Rutherford M.D.   On: 02/10/2020 00:00    Scheduled Meds: . Chlorhexidine Gluconate Cloth  6 each Topical Daily  . mouth rinse  15 mL Mouth Rinse BID  . metoprolol tartrate  2.5 mg Intravenous Q6H   Continuous Infusions: . sodium chloride 75 mL/hr at 02/10/20 1200  . diltiazem (CARDIZEM) infusion 5 mg/hr (02/10/20 1200)  . heparin 500 Units/hr (02/10/20 1200)     LOS: 1 day    Critical Care Procedure Note Authorized and Performed by: Maryln Manuel MD  Total Critical Care time:  34 mins  Due to a high probability of clinically significant, life threatening deterioration, the patient required my highest level of preparedness to intervene emergently and I personally spent this critical care time directly and personally managing the patient.  This critical care time included obtaining a history; examining the patient, pulse oximetry; ordering and review of studies; arranging urgent treatment with development of a management plan; evaluation of patient's response of treatment; frequent reassessment; and discussions with other providers.  This critical care time was performed to assess and manage the high probability of imminent and life threatening deterioration that could result in multi-organ  failure.  It was exclusive of separately billable procedures and treating other patients and teaching time.    Standley Dakins, MD [facility]   To contact the attending provider between 7A-7P or the covering provider during after hours 7P-7A, please log into the web site [email or web address] and access using universal [place] password for that web site. If you do not have the password, please call the hospital operator.  02/10/2020, 2:48 PM

## 2020-02-10 NOTE — Progress Notes (Signed)
NGT inserted with single attempt and no evidence of trauma  (no bleeding, coughing or impaired speech,difficulty breathing, etc.) X-ray pending/ordered. Immediate thick green return into tube

## 2020-02-10 NOTE — Progress Notes (Addendum)
ANTICOAGULATION CONSULT NOTE -  Pharmacy Consult for Heparin Indication: atrial flutter  No Known Allergies  Patient Measurements: Weight: 42.2 kg (93 lb 0.6 oz) Heparin Dosing Weight: 40.8 kg  Vital Signs: Temp: 98.2 F (36.8 C) (04/29 0721) Temp Source: Oral (04/29 0721) BP: 107/77 (04/29 0845) Pulse Rate: 89 (04/29 0845)  Labs: Recent Labs    02/09/20 1907 02/09/20 2127 02/10/20 0257 02/10/20 0931  HGB 15.5*  --  13.7  --   HCT 48.7*  --  42.6  --   PLT 183  --  164  --   APTT  --  28  --  108*  LABPROT  --  14.4  --   --   INR  --  1.2  --   --   HEPARINUNFRC  --  1.82*  --   --   CREATININE 2.19*  --  1.96*  --     Estimated Creatinine Clearance: 12.5 mL/min (A) (by C-G formula based on SCr of 1.96 mg/dL (H)).   Medical History: Past Medical History:  Diagnosis Date  . Atrial flutter (HCC)   . Hyperlipidemia   . Hypertension     Medications:  Medications Prior to Admission  Medication Sig Dispense Refill Last Dose  . acetaminophen (TYLENOL) 500 MG tablet Take 500 mg by mouth every 6 (six) hours as needed for mild pain or moderate pain.   02/08/2020 at Unknown time  . apixaban (ELIQUIS) 2.5 MG TABS tablet Take 1 tablet (2.5 mg total) by mouth 2 (two) times daily. 56 tablet 0 02/08/2020 at 2100  . benazepril-hydrochlorthiazide (LOTENSIN HCT) 20-25 MG tablet Take 1 tablet by mouth daily.    02/08/2020 at Unknown time  . donepezil (ARICEPT) 5 MG tablet Take 5 mg by mouth daily.   02/08/2020 at Unknown time  . metoprolol succinate (TOPROL XL) 25 MG 24 hr tablet Take 1 tablet (25 mg total) by mouth 2 (two) times daily. (Patient taking differently: Take 25 mg by mouth in the morning and at bedtime. ) 60 tablet 6 02/08/2020 at 2100  . rosuvastatin (CRESTOR) 5 MG tablet Take 5 mg by mouth daily.    02/08/2020 at Unknown time    Assessment: 86 y/o F with a h/o atrial flutter on apixaban with last dose 4/27 (time unknown) admitted with generalized weakness. Pharmacy  consulted to initiate heparin infusion while holding apixaban. Patient with SBO. HL elevated at 1.8, not correlating with APTT. APTT supratherapeutic slightly at 108.  Goal of Therapy:  Heparin level 0.3-0.7 units/ml APTT 66-102 sec Monitor platelets by anticoagulation protocol: Yes   Plan:  Decrease Heparin infusion  500 units/hr APTT in 8 hours Daily CBC/aPTT/HL until aPTT and HL correlate Monitor for s/s of bleeding  Elder Cyphers, BS Loura Back, BCPS Clinical Pharmacist Pager [phone] 02/10/2020,10:56 AM

## 2020-02-10 NOTE — Progress Notes (Signed)
ANTICOAGULATION CONSULT NOTE  Pharmacy Consult for Heparin Indication: atrial flutter  No Known Allergies  Patient Measurements: Weight: 42.2 kg (93 lb 0.6 oz) Heparin Dosing Weight: actual body weight  Vital Signs: Temp: 97.6 F (36.4 C) (04/29 2014) Temp Source: Oral (04/29 2014) BP: 107/73 (04/29 1900) Pulse Rate: 105 (04/29 1900)  Labs: Recent Labs    02/09/20 1907 02/09/20 2127 02/10/20 0257 02/10/20 0931 02/10/20 2001  HGB 15.5*  --  13.7  --   --   HCT 48.7*  --  42.6  --   --   PLT 183  --  164  --   --   APTT  --  28  --  108* 80*  LABPROT  --  14.4  --   --   --   INR  --  1.2  --   --   --   HEPARINUNFRC  --  1.82*  --   --   --   CREATININE 2.19*  --  1.96*  --   --     Estimated Creatinine Clearance: 12.5 mL/min (A) (by C-G formula based on SCr of 1.96 mg/dL (H)).   Medical History: Past Medical History:  Diagnosis Date  . Atrial flutter (HCC)   . Hyperlipidemia   . Hypertension     Assessment: 86 y/o F with hx of atrial flutter on apixaban with last dose 4/27 (time unknown) admitted with generalized weakness. Pharmacy consulted to initiate heparin infusion while holding apixaban. Patient with SBO. Baseline heparin level falsely elevated due to recent Apixaban.   Today, 02/10/20:  PM aPTT = 80 seconds, within therapeutic range  CBC: Hgb, Pltc WNL  No bleeding or infusion issues noted per nursing  Goal of Therapy:  Heparin level 0.3-0.7 units/ml APTT 66-102 sec Monitor platelets by anticoagulation protocol: Yes   Plan:   Continue heparin infusion at 500 units/hr  aPTT in 8 hours to confirm remains within therapeutic range  Daily CBC  Daily aPTT and heparin level until aPTT and heparin level correlate; then will monitor using heparin levels only   Monitor closely for s/sx of bleeding   Greer Pickerel, PharmD, BCPS Clinical Pharmacist  02/10/2020,8:42 PM

## 2020-02-10 NOTE — Consult Note (Signed)
Consultation Note Date: 02/10/2020   Patient Name: Beth Terry  DOB: 03/18/1927  MRN: [id number]  Age / Sex: 86 y.o., female  PCP: Beth Fire, MD Referring Physician: Murlean Iba, MD  Reason for Consultation: Establishing goals of care and Psychosocial/spiritual support  HPI/Patient Profile: 86 y.o. female  with past medical history of A.  Flutter on anticoagulation, HTN/HLD, history of abdominal hysterectomy, admitted on 02/09/2020 with small bowel obstruction treated medically at this point, A. fib with RVR, significant lactic acidosis of 7.3, acute kidney injury.   Clinical Assessment and Goals of Care: I have reviewed medical records including EPIC notes, labs and imaging, received report from attending and bedside nursing staff, examined the patient and met at bedside with daughter Beth Terry to discuss diagnosis prognosis, [place], EOL wishes, disposition and options.  I introduced Palliative Medicine as specialized medical care for people living with serious illness. It focuses on providing relief from the symptoms and stress of a serious illness.   We discussed a brief life review of the patient.  Beth Terry shares that she and Beth Terry live together.  Beth Terry provides most daily care, helping her mother with bathing and dressing.  Beth Terry shares that she feels that Beth Terry has lost about 10 pounds over the last month.  She shares that she is eating less, no longer interested in some of her favorite foods.  She also shares that she has "taken to her bed".  She will only walk back and forth to the bathroom or the living room, but prefers to spend most of her time in the bed over the last month.   Beth Terry shares that she feels that her mother "does not want to be here anymore".  We talk in detail about SBO, the treatment plan of decompression through NG.  We also talked about bowel regimen.  I  share that if the decompression with NG is not effective, she may be a candidate for surgery.  We discussed laparoscopic surgery and also open surgery.  Beth Terry shares that they would not want to put their mother through surgery, but daughter Beth Terry is not present for our discussion.  I share that Beth Terry may not be a candidate for surgery, and Beth Terry states understanding.  I share that if decompression does not work, and family does not want surgery, then Beth Terry would be nearing end-of-life.  We discussed her current illness and what it means in the larger context of her on-going co-morbidities.  Natural disease trajectory and expectations at EOL were discussed.  Advanced directives, concepts specific to code status, were considered and discussed.  We talked about the realities of CPR/chest compressions and intubation/life support.  I shared that the medical team recommends "treat the treatable" but allowing natural passing (DNR).  Beth Terry states that she believes the family would want to allow a natural passing, but needs to speak with her Sister Beth Terry.  Hospice and Palliative Care services outpatient were explained and offered.  I share that if decompression is ineffective, no surgery  is offered, or if it is offered and they declined, then Beth Terry would qualify for at home hospice or residential hospice.  We briefly talked about what is provided with hospice care.  Questions and concerns were addressed.  The family was encouraged to call with questions or concerns.   Conference with attending, bedside nursing staff related to patient condition, needs, goals of care.  HCPOA    NEXT OF KIN -adult daughters Beth Terry and Beth Terry.  Mrs. Beth Terry and [place] live in the same home.    SUMMARY OF RECOMMENDATIONS   Time for outcomes At this point Beth Terry states that they would not want any surgery Considering CODE STATUS, recommend treat the treatable but allowing natural  passing   Code Status/Advance Care Planning:  Full code -days he and I talked about the realities of CPR/intubation.  Sure that the medical team would recommend "treat the treatable" but allowing natural death (DNR).  Beth Terry tells me she will discuss this with her sister Beth Terry.  Symptom Management:   Per hospitalist, no additional needs at this time.  Palliative Prophylaxis:   Bowel Regimen and Frequent Pain Assessment  Additional Recommendations (Limitations, Scope, Preferences):  Beth Terry states that they would not want surgery, but we also need to hear from Beth Terry.  Psycho-social/Spiritual:   Desire for further Chaplaincy support:no  Additional Recommendations: Caregiving  Support/Resources and Education on Hospice  Prognosis:   Unable to determine, based on outcomes.  I share with Beth Terry that if bowel obstruction does not resolve with suction, and they do not want surgery, then [name] is nearing end-of-life.  I shared that she would easily qualify for at home hospice or transition to residential hospice in [place].  Discharge Planning: To be determined, based on outcomes and family choice.      Primary Diagnoses: Present on Admission: . SBO (small bowel obstruction) ([place])   I have reviewed the medical record, interviewed the patient and family, and examined the patient. The following aspects are pertinent.  Past Medical History:  Diagnosis Date  . Atrial flutter ([place])   . Hyperlipidemia   . Hypertension    Social History   Socioeconomic History  . Marital status: Widowed    Spouse name: Not on file  . Number of children: Not on file  . Years of education: Not on file  . Highest education level: Not on file  Occupational History  . Not on file  Tobacco Use  . Smoking status: Never Smoker  . Smokeless tobacco: Never Used  Substance and Sexual Activity  . Alcohol use: Never  . Drug use: Never  . Sexual activity: Not on file  Other Topics Concern   . Not on file  Social History Narrative  . Not on file   Social Determinants of Health   Financial Resource Strain:   . Difficulty of Paying Living Expenses:   Food Insecurity:   . Worried About Charity fundraiser in the Last Year:   . Arboriculturist in the Last Year:   Transportation Needs:   . Film/video editor (Medical):   Marland Kitchen Lack of Transportation (Non-Medical):   Physical Activity:   . Days of Exercise per Week:   . Minutes of Exercise per Session:   Stress:   . Feeling of Stress :   Social Connections:   . Frequency of Communication with Friends and Family:   . Frequency of Social Gatherings with Friends and Family:   . Attends Religious Services:   .  Active Member of Clubs or Organizations:   . Attends Archivist Meetings:   Marland Kitchen Marital Status:    Family History  Problem Relation Age of Onset  . Hypertension Mother    Scheduled Meds: . Chlorhexidine Gluconate Cloth  6 each Topical Daily  . mouth rinse  15 mL Mouth Rinse BID  . metoprolol tartrate  2.5 mg Intravenous Q6H  . milk and molasses  1 enema Rectal Once   Continuous Infusions: . sodium chloride 75 mL/hr at 02/10/20 1200  . diltiazem (CARDIZEM) infusion 5 mg/hr (02/10/20 1200)  . heparin 500 Units/hr (02/10/20 1200)   PRN Meds:.acetaminophen **OR** acetaminophen, HYDROmorphone (DILAUDID) injection, ondansetron **OR** ondansetron (ZOFRAN) IV Medications Prior to Admission:  Prior to Admission medications   Medication Sig Start Date End Date Taking? Authorizing Provider  acetaminophen (TYLENOL) 500 MG tablet Take 500 mg by mouth every 6 (six) hours as needed for mild pain or moderate pain.   Yes [provider]  apixaban (ELIQUIS) 2.5 MG TABS tablet Take 1 tablet (2.5 mg total) by mouth 2 (two) times daily. 11/23/19  Yes Herminio Commons, MD  benazepril-hydrochlorthiazide (LOTENSIN HCT) 20-25 MG tablet Take 1 tablet by mouth daily.  06/08/19  Yes [provider]   donepezil (ARICEPT) 5 MG tablet Take 5 mg by mouth daily. 11/19/19  Yes [provider]  metoprolol succinate (TOPROL XL) 25 MG 24 hr tablet Take 1 tablet (25 mg total) by mouth 2 (two) times daily. Patient taking differently: Take 25 mg by mouth in the morning and at bedtime.  01/04/20  Yes Herminio Commons, MD  rosuvastatin (CRESTOR) 5 MG tablet Take 5 mg by mouth daily.  06/08/19  Yes [provider]   No Known Allergies Review of Systems  Unable to perform ROS: Age    Physical Exam Vitals and nursing note reviewed.  Constitutional:      General: She is not in acute distress.    Appearance: She is ill-appearing.  HENT:     Head: Atraumatic.  Cardiovascular:     Rate and Rhythm: Normal rate.  Pulmonary:     Effort: Pulmonary effort is normal. No respiratory distress.  Abdominal:     General: Abdomen is flat. There is no distension.     Tenderness: There is no guarding.  Musculoskeletal:        General: No swelling.     Comments: Frail and thin  Skin:    General: Skin is warm and dry.  Neurological:     Mental Status: She is alert.     Comments: Oriented to person and place, not time, known memory loss  Psychiatric:     Comments: Calm and cooperative, not fearful     Vital Signs: BP 118/88   Pulse 92   Temp 98 F (36.7 C) (Oral)   Resp (!) 28   Wt 42.2 kg   SpO2 94%   BMI 15.48 kg/m  Pain Scale: 0-10   Pain Score: 0-No pain   SpO2: SpO2: 94 % O2 Device:SpO2: 94 % O2 Flow Rate: .   IO: Intake/output summary:   Intake/Output Summary (Last 24 hours) at 02/10/2020 1350 Last data filed at 02/10/2020 1200 Gross per 24 hour  Intake 1268.3 ml  Output 375 ml  Net 893.3 ml    LBM:   Baseline Weight: Weight: 40.8 kg Most recent weight: Weight: 42.2 kg     Palliative Assessment/Data:   Flowsheet Rows     Most Recent Value  Intake Tab  Referral Department  Hospitalist  Unit at Time of Referral  Intermediate Care Unit  Palliative Care  Primary Diagnosis  Other (Comment)  Date Notified  02/10/20  Palliative Care Type  New Palliative care  Reason for referral  Clarify Goals of Care  Date of Admission  02/09/20  Date first seen by Palliative Care  02/10/20  # of days Palliative referral response time  0 Day(s)  # of days IP prior to Palliative referral  1  Clinical Assessment  Palliative Performance Scale Score  20%  Pain Max last 24 hours  Not able to report  Pain Min Last 24 hours  Not able to report  Dyspnea Max Last 24 Hours  Not able to report  Dyspnea Min Last 24 hours  Not able to report  Psychosocial & Spiritual Assessment  Palliative Care Outcomes      Time In: 1510 Time Out: 1600 Time Total: 50 minutes Greater than 50%  of this time was spent counseling and coordinating care related to the above assessment and plan.  Signed by: Drue Novel, NP   Please contact Palliative Medicine Team phone at [phone] for questions and concerns.  For individual provider: See Shea Evans

## 2020-02-10 NOTE — Consult Note (Addendum)
Cardiology Consult    Patient ID: Beth Terry; [id number]; January 29, 1927   Admit date: 02/09/2020 Date of Consult: 02/10/2020  Primary Care Provider: Avon Gully, MD Primary Cardiologist: Prentice Docker, MD   Patient Profile    Beth Terry is a 86 y.o. female with past medical history of chronic combined systolic and diastolic CHF (EF 50% by echo in 11/2019 and felt to be tachycardia-mediated), valvular heart disease (moderate MR, moderate to severe TR and moderate AI), persistent atrial flutter, HTN and HLD who is being seen today for the evaluation of atrial flutter with RVR and preoperative cardiac clearance at the request of Dr. Laural Benes.   History of Present Illness    Ms. Beth Terry most recently had a telehealth visit with Dr. Purvis Sheffield in 12/2019 and denied any chest pain, palpitations or dyspnea at that time. Recent echo had shown EF was reduced at 40% and was thought to be secondary to her atrial flutter with RVR. The patient and her daughter were not interested in DCCV at that time given her advanced age and a rate-control strategy was pursued. Lopressor 25mg  BID was titrated to Toprol-XL 25mg  BID in the setting of her cardiomyopathy and she was continued on Eliquis 2.5mg  BID along with Benazepril-HCTZ 20-25mg  daily and Crestor 5mg  daily.   She presented to [facility] ED on 02/09/2020 for evaluation of abdominal pain, nausea and weakness for the past 5 days. Initial labs showed WBC 11.6, Hgb 15.5, platelets 183, Na+ 139, K+ 4.5 and creatinine 2.19 (previously 1.1 - 1.2). AST 33 and ALT < 5. Lipase 48. Lactic Acid 7.3 on admit with repeat improving to 5.7, 4.2 and 2.7. COVID negative. CXR showed two small nodular opacities overlying the right mid lung. CT Abdomen showed findings consistent with mechanical small bowel obstruction and a small amount of abdominal and pelvic ascites. EKG showed atrial flutter with RVR, HR 138.  She had been admitted for further  management and has been kept NPO for her SBO and had an NG tube placed. She was started on IV Cardizem for rate-control and Eliquis has been held with Heparin being initiated. Remains on IV Cardizem 10 mg/hr for now and has been receiving IV Lopressor 2.5mg  Q8H due to NPO status.   In talking with the patient this morning, she reports still having abdominal discomfort but this has started to improve. Denies any palpitations. Was unaware of her elevated rate at the time of admission. No recent chest pain and breathing has been at baseline. No recent orthopnea, PND or edema.    Past Medical History:  Diagnosis Date  . Atrial flutter (HCC)   . Hyperlipidemia   . Hypertension     Past Surgical History:  Procedure Laterality Date  . ABDOMINAL HYSTERECTOMY       Home Medications:  Prior to Admission medications   Medication Sig Start Date End Date Taking? Authorizing Provider  acetaminophen (TYLENOL) 500 MG tablet Take 500 mg by mouth every 6 (six) hours as needed for mild pain or moderate pain.   Yes [provider]  apixaban (ELIQUIS) 2.5 MG TABS tablet Take 1 tablet (2.5 mg total) by mouth 2 (two) times daily. 11/23/19  Yes [facility], MD  benazepril-hydrochlorthiazide (LOTENSIN HCT) 20-25 MG tablet Take 1 tablet by mouth daily.  06/08/19  Yes [provider]  donepezil (ARICEPT) 5 MG tablet Take 5 mg by mouth daily. 11/19/19  Yes [provider]  metoprolol succinate (TOPROL XL) 25 MG 24 hr  tablet Take 1 tablet (25 mg total) by mouth 2 (two) times daily. Patient taking differently: Take 25 mg by mouth in the morning and at bedtime.  01/04/20  Yes Laqueta LindenKoneswaran, Suresh A, MD  rosuvastatin (CRESTOR) 5 MG tablet Take 5 mg by mouth daily.  06/08/19  Yes [provider]    Inpatient Medications: Scheduled Meds: . Chlorhexidine Gluconate Cloth  6 each Topical Daily  . metoprolol tartrate  2.5 mg Intravenous Q6H   Continuous Infusions: . sodium chloride  100 mL/hr at 02/10/20 0600  . diltiazem (CARDIZEM) infusion 15 mg/hr (02/10/20 0600)  . heparin 550 Units/hr (02/10/20 0600)   PRN Meds: acetaminophen **OR** acetaminophen, ondansetron **OR** ondansetron (ZOFRAN) IV  Allergies:   No Known Allergies  Social History:   Social History   Socioeconomic History  . Marital status: Widowed    Spouse name: Not on file  . Number of children: Not on file  . Years of education: Not on file  . Highest education level: Not on file  Occupational History  . Not on file  Tobacco Use  . Smoking status: Never Smoker  . Smokeless tobacco: Never Used  Substance and Sexual Activity  . Alcohol use: Never  . Drug use: Never  . Sexual activity: Not on file  Other Topics Concern  . Not on file  Social History Narrative  . Not on file   Social Determinants of Health   Financial Resource Strain:   . Difficulty of Paying Living Expenses:   Food Insecurity:   . Worried About Programme researcher, broadcasting/film/videounning Out of Food in the Last Year:   . Baristaan Out of Food in the Last Year:   Transportation Needs:   . Freight forwarderLack of Transportation (Medical):   Marland Kitchen. Lack of Transportation (Non-Medical):   Physical Activity:   . Days of Exercise per Week:   . Minutes of Exercise per Session:   Stress:   . Feeling of Stress :   Social Connections:   . Frequency of Communication with Friends and Family:   . Frequency of Social Gatherings with Friends and Family:   . Attends Religious Services:   . Active Member of Clubs or Organizations:   . Attends BankerClub or Organization Meetings:   Marland Kitchen. Marital Status:   Intimate Partner Violence:   . Fear of Current or Ex-Partner:   . Emotionally Abused:   Marland Kitchen. Physically Abused:   . Sexually Abused:      Family History:    Family History  Problem Relation Age of Onset  . Hypertension Mother       Review of Systems    General:  No chills, fever, night sweats or weight changes. Positive for weakness.  Cardiovascular:  No chest pain, dyspnea on  exertion, edema, orthopnea, palpitations, paroxysmal nocturnal dyspnea. Dermatological: No rash, lesions/masses Respiratory: No cough, dyspnea Urologic: No hematuria, dysuria Abdominal:   No nausea, vomiting, diarrhea, bright red blood per rectum, melena, or hematemesis. Positive for abdominal pain.  Neurologic:  No visual changes, wkns, changes in mental status. All other systems reviewed and are otherwise negative except as noted above.  Physical Exam/Data    Vitals:   02/10/20 0802 02/10/20 0815 02/10/20 0830 02/10/20 0845  BP: 106/75 102/67 101/74 107/77  Pulse: (!) 33 76 (!) 102 89  Resp: (!) 29 (!) 22 (!) 28 (!) 28  Temp:      TempSrc:      SpO2: 94% 95% 92% 93%  Weight:        Intake/Output  Summary (Last 24 hours) at 02/10/2020 0926 Last data filed at 02/10/2020 0600 Gross per 24 hour  Intake 595.2 ml  Output 375 ml  Net 220.2 ml   Filed Weights   02/09/20 1757 02/10/20 0046  Weight: 40.8 kg 42.2 kg   Body mass index is 15.48 kg/m.   General: Pleasant elderly female appearing in NAD Psych: Normal affect. Neuro: Alert and oriented X 3. Moves all extremities spontaneously. HEENT: NG tube in place.   Neck: Supple without bruits or JVD. Lungs:  Resp regular and unlabored, CTA without wheezing or rales. Heart: Irregularly irregular no s3, s4. 2/6 holosystolic murmur along Apex.  Abdomen: Soft, non-tender, non-distended, BS + x 4.  Extremities: No clubbing, cyanosis or lower extremity edema. DP/PT/Radials 2+ and equal bilaterally.   EKG:  The EKG was personally reviewed and demonstrates: atrial flutter with RVR, HR 138.  Telemetry:  Telemetry was personally reviewed and demonstrates:  Atrial flutter with variable block. No significant pauses. HR variable from 80's to 110's.    Labs/Studies     Relevant CV Studies:  Echocardiogram: 12/09/2019  IMPRESSIONS    1. Left ventricular ejection fraction, by estimation, is 40%. The left  ventricle has mildly  decreased function. The left ventricle demonstrates  regional wall motion abnormalities (see scoring diagram/findings for  description). Left ventricular diastolic  parameters are indeterminate.  2. Right ventricular systolic function is moderately reduced. The right  ventricular size is mildly enlarged. There is moderately elevated  pulmonary artery systolic pressure.  3. Left atrial size was severely dilated.  4. Right atrial size was severely dilated.  5. There is partial prolapse of the posterior MV leaflet. The mitral  valve is abnormal. Moderate mitral valve regurgitation. No evidence of  mitral stenosis.  6. Tricuspid valve regurgitation is moderate to severe.  7. The aortic valve is tricuspid. Aortic valve regurgitation is moderate.  No aortic stenosis is present.  8. The inferior vena cava is normal in size with greater than 50%  respiratory variability, suggesting right atrial pressure of 3 mmHg.   Laboratory Data:  Chemistry Recent Labs  Lab 02/09/20 1907 02/10/20 0257  NA 139 139  K 4.5 4.1  CL 95* 100  CO2 24 25  GLUCOSE 165* 161*  BUN 59* 63*  CREATININE 2.19* 1.96*  CALCIUM 9.5 8.4*  GFRNONAA 19* 22*  GFRAA 22* 25*  ANIONGAP 20* 14    Recent Labs  Lab 02/09/20 1907 02/10/20 0257  PROT 8.5* 6.9  ALBUMIN 4.2 3.5  AST 33 26  ALT <5 20  ALKPHOS 48 41  BILITOT 1.3* 1.4*   Hematology Recent Labs  Lab 02/09/20 1907 02/10/20 0257  WBC 11.6* 10.0  RBC 5.40* 4.68  HGB 15.5* 13.7  HCT 48.7* 42.6  MCV 90.2 91.0  MCH 28.7 29.3  MCHC 31.8 32.2  RDW 16.5* 16.3*  PLT 183 164   Cardiac EnzymesNo results for input(s): TROPONINI in the last 168 hours. No results for input(s): TROPIPOC in the last 168 hours.  BNPNo results for input(s): BNP, PROBNP in the last 168 hours.  DDimer No results for input(s): DDIMER in the last 168 hours.  Radiology/Studies:  CT ABDOMEN PELVIS WO CONTRAST  Result Date: 02/09/2020 CLINICAL DATA:  Weak and lethargic  right-sided pain EXAM: CT ABDOMEN AND PELVIS WITHOUT CONTRAST TECHNIQUE: Multidetector CT imaging of the abdomen and pelvis was performed following the standard protocol without IV contrast. COMPARISON:  Ultrasound 09/02/2017 FINDINGS: Lower chest: Lung bases demonstrate no acute consolidation or  pleural effusion. Mild bronchiectasis in the lower lobes. Mild cardiomegaly. Hepatobiliary: No focal hepatic abnormality. No calcified gallstone or biliary dilatation Pancreas: Unremarkable. No pancreatic ductal dilatation or surrounding inflammatory changes. Spleen: Normal in size without focal abnormality. Adrenals/Urinary Tract: Adrenal glands are normal. Kidneys show no hydronephrosis. Cyst in the lower pole of the right kidney measuring 3.6 cm. The bladder is unremarkable Stomach/Bowel: Moderate fluid distension of the stomach. Diffuse fluid distension of the small bowel measuring up to 3.3 cm. Fecalized segment of distal small bowel in the pelvis. Nondistended colon filled with stool. Suspected transition point in the pelvis slightly to the right of midline, though limited details due to absence of contrast, lack of intra-abdominal fat, and the presence of ascites. No bowel wall thickening. No intramural air. Negative appendix. Vascular/Lymphatic: Extensive aortic atherosclerosis. No aneurysm. No suspicious adenopathy Reproductive: Status post hysterectomy.  No adnexal mass. Other: No free air. Small amount of ascites within the abdomen and pelvis. Slight increase densities within posterior pelvic ascites fluid. Musculoskeletal: Degenerative changes with grade 1 anterolisthesis L5 on S1. No acute osseous abnormality. IMPRESSION: 1. Findings consistent with mechanical small bowel obstruction. Suspect that the transition point is in the terminal ileal region, slightly to the right of midline in the pelvis though details are limited by the absence of enteral contrast, paucity of fat in the region and presence of ascites.  2. Small amount of abdominal and pelvic ascites. Small amount high density ascites in the posterior pelvis, could be hemorrhagic or proteinaceous. Electronically Signed   By: Jasmine Pang M.D.   On: 02/09/2020 21:04   DG Chest Port 1 View  Result Date: 02/10/2020 CLINICAL DATA:  NG tube repositioning EXAM: PORTABLE CHEST 1 VIEW COMPARISON:  February 09, 2020 11:44 p.m. FINDINGS: Again noted is cardiomegaly. Aortic knob calcifications. There is interval repositioning of the NG tube which is not seen below the diaphragm within the proximal stomach. There is mildly dilated air-filled loops of small bowel within the right upper quadrant. The lungs are clear. No focal airspace consolidation. No acute osseous abnormality. IMPRESSION: *Interval repositioning of the NG tube within the proximal stomach. *Mildly dilated air-filled loops of small bowel in the right upper quadrant. Electronically Signed   By: Jonna Clark M.D.   On: 02/10/2020 03:11   DG Chest Portable 1 View  Result Date: 02/09/2020 CLINICAL DATA:  Weakness EXAM: PORTABLE CHEST 1 VIEW COMPARISON:  11/19/2019 FINDINGS: Two small nodular opacities overlying the right mid lung. No new consolidation or edema. No pleural effusion or pneumothorax. Stable cardiomediastinal contours with normal heart size IMPRESSION: Two small nodular opacities overlying the right mid lung. Chest CT could be considered as indicated in this patient. Electronically Signed   By: Guadlupe Spanish M.D.   On: 02/09/2020 19:24   DG Chest Port 1V same Day  Result Date: 02/10/2020 CLINICAL DATA:  NG tube placement. EXAM: PORTABLE CHEST 1 VIEW COMPARISON:  Chest radiograph and abdominal CT earlier this day. FINDINGS: The enteric tube is kinked at the gastroesophageal junction with the tip directed cranially in the distal esophagus. Recommend repositioning. Low lung volumes. No acute airspace disease. Gaseous distention of bowel in the upper abdomen. IMPRESSION: The enteric tube is  kinked at the gastroesophageal junction with the tip directed cranially in the distal esophagus. Recommend repositioning. Electronically Signed   By: Narda Rutherford M.D.   On: 02/10/2020 00:00     Assessment & Plan    1. Atrial Flutter with RVR - this was a  new diagnosis for the patient as of 11/2019 and a rate-control strategy has been pursued in the setting of her advanced age as the patient and her family did not wish to proceed with DCCV.  - currently admitted with SBO and rates were in the 130's on admission. Suspect her acute illness is driving her tachycardia. Currently on IV Cardizem 10 mg/hr and IV Lopressor 2.5 Q8H. Cardizem is not ideal long-term given her reduced EF. Discussed with the patient's nurse and will titrate IV Lopressor to 2.5mg  Q6H and wean IV Cardizem if HR allows. If not, may need to continue for now given current NPO status. BP has been soft (at 107/77 on most recent check). K+ at 4.1 this AM. Will check Mg and TSH.  - This patients CHA2DS2-VASc Score and unadjusted Ischemic Stroke Rate (% per year) is equal to 7.2 % stroke rate/year from a score of 5 (CHF, HTN, Female, Age (2)). On Eliquis 2.5mg  BID prior to admission. Currently held and being bridged with Heparin in case invasive procedures were indicated.   2. Preoperative Cardiac Clearance for Surgical Repair of SBO - I spoke with Dr. Lovell Sheehan about this patient and he is not planning to take her to the OR. Focusing on more conservative management at this time with NG tube placement. She would be high-risk for surgical intervention given her advanced age, known cardiomyopathy and underlying valvular heart disease.   3. Chronic Combined Systolic and Diastolic CHF/Cardiomyopathy - recent echo showed a reduced EF 40% which is felt to be tachycardia-mediated. Also noted to have valvular heart disease with moderate MR, moderate to severe TR and moderate AI.  - she is receiving IVF at 100 mL/hr so will need to monitor volume  status closely. Transition to BB therapy if HR allows. PTA ACE-I currently held given AKI.   4. SBO/Lactic Acidosis -  Presented with weakness and abdominal pain, found to have an SBO. NG tube currently in place and general surgery is following. Lactic Acid 7.3 on admit and improved to 2.7 on most recent check.  - further management per admitting team.   5. AKI - baseline creatinine ~ 1.1. Elevated to 2.19 on admission, improved to 1.96 today. PTA Benazepril-HCTZ currently held.    For questions or updates, please contact [facility] Please consult [email or web address] for contact info under Cardiology/STEMI.  Signed, Ellsworth Lennox, PA-C 02/10/2020, 9:26 AM Pager: [phone]   Patient seen and discussed with PA Iran Ouch, I agree with her documentation above. 86 yo female history of aflutter. Notes indicate previously turned down possible DCCV, undergoing rate control strategy only. Chronic systolic HF LVEF 62% thought to be tachy mediated. Admitted with generalized weakness and poor oral intake, +abdominal pain. CT imaging in ER showed SBO, admitted for management. Admission complicated by hypotension, lactic acidosis, AKI. Cardiology consulted for issues with aflutter and tachcyardia    WBC 11. 6 Hgb 15.5 Plt 183 K 4.5 Cr 2.19 BUN 59 Lipase 48 Lactic acid 7.3  EKG  CXR small lung nodules CT A/P: SBO 11/2019 echo  LVEF 40%, mod RV dysfunction, severe BAE, mod MR, mod to severe TR, mod AI  Patient with history of aflutter under rate control as outpatient. Presented with SBO, in setting of poor oral intake and severe systemic stress she has had issues with aflutter with RVR. She is npo with NG tube on dilt gtt along with IV lopressor 2.5mg  every 6 hours, continue until able to resume oral meds. Mild LV systolic dysfunction, I think  in this setting IV dilt is fine.  Off eliquis and on hep gtt, resume eliquis when ok from oral standpoint and surgery has been excluded.   From surgical  standpoint revised cardiac risk index score of 6%, ACS NSQIP risk for major compilcations 9%, any complication 84%. From purely cardiac standpoint not prohibitive but certaintly increased risk. These risks are based on her advanced age and medical comorbidities and are not modifiable. Would not recommend any additional cardiac testing. Hopefully will significantly improve with conservative management of her SBO  We will follow telemetry and review patient date remotely tomorrow  Carlyle Dolly MD

## 2020-02-10 NOTE — Consult Note (Signed)
Reason for Consult: Small bowel obstruction Referring Physician: Dr. Amelia Jo is an 86 y.o. female.  HPI: Patient is a 86 year old black female who presented to the emergency room with generalized weakness and abdominal discomfort.  Apparently patient had an episode of emesis prior to coming to the emergency room.  History is being obtained from the notes as the patient is not too communicative and no family members are present.  While in the emergency room, she had a CT scan of the abdomen which revealed multiple dilated loops of small bowel with fecalization of the distal small bowel.  A transition zone is possibly seen in the pelvis.  She is status post a hysterectomy in the remote past.  She does have a stool burden in the rectum and descending colon along with evidence of possible diverticulosis.  She was noted to be in rapid atrial flutter and had an elevated lactic acid level.  She has been resuscitated overnight and transferred to the intensive care unit.  An NG tube was placed and is putting out thick greenish material.  Her lactic acid level has improved with hydration.  Her heart rate is now normalized on Cardizem drip.  She was on Eliquis and this has been changed to heparin drip.  Past Medical History:  Diagnosis Date  . Atrial flutter ([place])   . Hyperlipidemia   . Hypertension     Past Surgical History:  Procedure Laterality Date  . ABDOMINAL HYSTERECTOMY      Family History  Problem Relation Age of Onset  . Hypertension Mother     Social History:  reports that she has never smoked. She has never used smokeless tobacco. She reports that she does not drink alcohol or use drugs.  Allergies: No Known Allergies  Medications:  Scheduled: . Chlorhexidine Gluconate Cloth  6 each Topical Daily  . metoprolol tartrate  2.5 mg Intravenous Q6H  . milk and molasses  1 enema Rectal Once    Results for orders placed or performed during the hospital encounter of  02/09/20 (from the past 48 hour(s))  Urinalysis, Routine w reflex microscopic     Status: Abnormal   Collection Time: 02/09/20  6:42 PM  Result Value Ref Range   Color, Urine YELLOW YELLOW   APPearance CLOUDY (A) CLEAR   Specific Gravity, Urine 1.022 1.005 - 1.030   pH 5.0 5.0 - 8.0   Glucose, UA NEGATIVE NEGATIVE mg/dL   Hgb urine dipstick SMALL (A) NEGATIVE   Bilirubin Urine NEGATIVE NEGATIVE   Ketones, ur NEGATIVE NEGATIVE mg/dL   Protein, ur >=300 (A) NEGATIVE mg/dL   Nitrite NEGATIVE NEGATIVE   Leukocytes,Ua NEGATIVE NEGATIVE   RBC / HPF 6-10 0 - 5 RBC/hpf   WBC, UA 6-10 0 - 5 WBC/hpf   Bacteria, UA RARE (A) NONE SEEN   Squamous Epithelial / LPF 0-5 0 - 5   Mucus PRESENT    Hyaline Casts, UA PRESENT     Comment: Performed at [facility], [address], [place], [place] [address]  CBC with Differential     Status: Abnormal   Collection Time: 02/09/20  7:07 PM  Result Value Ref Range   WBC 11.6 (H) 4.0 - 10.5 K/uL   RBC 5.40 (H) 3.87 - 5.11 MIL/uL   Hemoglobin 15.5 (H) 12.0 - 15.0 g/dL   HCT 48.7 (H) 36.0 - 46.0 %   MCV 90.2 80.0 - 100.0 fL   MCH 28.7 26.0 - 34.0 pg   MCHC  31.8 30.0 - 36.0 g/dL   RDW 16.116.5 (H) 09.611.5 - 04.515.5 %   Platelets 183 150 - 400 K/uL   nRBC 0.0 0.0 - 0.2 %   Neutrophils Relative % 82 %   Neutro Abs 9.5 (H) 1.7 - 7.7 K/uL   Lymphocytes Relative 10 %   Lymphs Abs 1.1 0.7 - 4.0 K/uL   Monocytes Relative 7 %   Monocytes Absolute 0.8 0.1 - 1.0 K/uL   Eosinophils Relative 0 %   Eosinophils Absolute 0.0 0.0 - 0.5 K/uL   Basophils Relative 0 %   Basophils Absolute 0.0 0.0 - 0.1 K/uL   Immature Granulocytes 1 %   Abs Immature Granulocytes 0.10 (H) 0.00 - 0.07 K/uL    Comment: Performed at [facility], [address] [place], [place] [address]  Comprehensive metabolic panel     Status: Abnormal   Collection Time: 02/09/20  7:07 PM  Result Value Ref Range   Sodium 139 135 - 145 mmol/L   Potassium 4.5 3.5 - 5.1 mmol/L   Chloride 95 (L) 98 - 111 mmol/L    CO2 24 22 - 32 mmol/L   Glucose, Bld 165 (H) 70 - 99 mg/dL    Comment: Glucose reference range applies only to samples taken after fasting for at least 8 hours.   BUN 59 (H) 8 - 23 mg/dL   Creatinine, Ser 1.912.19 (H) 0.44 - 1.00 mg/dL   Calcium 9.5 8.9 - 47.810.3 mg/dL   Total Protein 8.5 (H) 6.5 - 8.1 g/dL   Albumin 4.2 3.5 - 5.0 g/dL   AST 33 15 - 41 U/L   ALT <5 0 - 44 U/L   Alkaline Phosphatase 48 38 - 126 U/L   Total Bilirubin 1.3 (H) 0.3 - 1.2 mg/dL   GFR calc non Af Amer 19 (L) >60 mL/min   GFR calc Af Amer 22 (L) >60 mL/min   Anion gap 20 (H) 5 - 15    Comment: Performed at [facility], [address] [place], [place] [address]  Lipase, blood     Status: None   Collection Time: 02/09/20  7:07 PM  Result Value Ref Range   Lipase 48 11 - 51 U/L    Comment: Performed at [facility], [address] [place], [place] [address]  Lactic acid, plasma     Status: Abnormal   Collection Time: 02/09/20  7:07 PM  Result Value Ref Range   Lactic Acid, Venous 7.3 (HH) 0.5 - 1.9 mmol/L    Comment: CRITICAL RESULT CALLED TO, READ BACK BY AND VERIFIED WITH: WESTON,L ON 02/09/20 AT 2110 BY LOY,C Performed at  [facility], [address] [place], [place] [address]   Blood culture (routine x 2)     Status: None (Preliminary result)   Collection Time: 02/09/20  9:24 PM   Specimen: Vein; Blood  Result Value Ref Range   Specimen Description BLOOD LEFT ARM    Special Requests      BOTTLES DRAWN AEROBIC AND ANAEROBIC Blood Culture adequate volume Performed at [facility], [address] [place], [place] [address]    Culture PENDING    Report Status PENDING   Lactic acid, plasma     Status: Abnormal   Collection Time: 02/09/20  9:27 PM  Result Value Ref Range   Lactic Acid, Venous 5.7 (HH) 0.5 - 1.9 mmol/L    Comment: CRITICAL VALUE NOTED.  VALUE IS CONSISTENT WITH PREVIOUSLY REPORTED AND CALLED VALUE. Performed at [facility], [id number]  [address], [place], [place] [address]   Blood culture  (routine x 2)     Status: None (Preliminary result)   Collection Time: 02/09/20  9:27 PM   Specimen: Vein; Blood  Result Value Ref Range   Specimen Description BLOOD LEFT HAND    Special Requests      BOTTLES DRAWN AEROBIC AND ANAEROBIC Blood Culture adequate volume Performed at [facility], [address], [place], [place] [address]    Culture PENDING    Report Status PENDING   APTT     Status: None   Collection Time: 02/09/20  9:27 PM  Result Value Ref Range   aPTT 28 24 - 36 seconds    Comment: Performed at [facility], [address], [place], [place] [address]  Protime-INR     Status: None   Collection Time: 02/09/20  9:27 PM  Result Value Ref Range   Prothrombin Time 14.4 11.4 - 15.2 seconds   INR 1.2 0.8 - 1.2    Comment: (NOTE) INR goal varies based on device and disease states. Performed at [facility], [address], [place], [place] [address]   Heparin level (unfractionated)     Status: Abnormal   Collection Time: 02/09/20  9:27 PM  Result Value Ref Range   Heparin Unfractionated 1.82 (H) 0.30 - 0.70 IU/mL    Comment: RESULTS CONFIRMED BY MANUAL DILUTION (NOTE) If heparin results are below expected values, and patient dosage has  been confirmed, suggest follow up testing of antithrombin III levels. Performed at [facility], [address] [place], [place] [address]   Respiratory Panel by RT PCR (Flu A&B, Covid) - Nasopharyngeal Swab     Status: None   Collection Time: 02/09/20  9:29 PM   Specimen: Nasopharyngeal Swab  Result Value Ref Range   SARS Coronavirus 2 by RT PCR NEGATIVE NEGATIVE    Comment: (NOTE) SARS-CoV-2 target nucleic acids are NOT DETECTED. The SARS-CoV-2 RNA is generally detectable in upper respiratoy specimens during the acute phase of infection. The lowest concentration of SARS-CoV-2 viral copies this assay can detect is 131 copies/mL. A negative result does not preclude SARS-Cov-2 infection and should not be used as the sole basis for  treatment or other patient management decisions. A negative result may occur with  improper specimen collection/handling, submission of specimen other than nasopharyngeal swab, presence of viral mutation(s) within the areas targeted by this assay, and inadequate number of viral copies (<131 copies/mL). A negative result must be combined with clinical observations, patient history, and epidemiological information. The expected result is Negative. Fact Sheet for Patients:  https://www.moore.com/ Fact Sheet for Healthcare Providers:  https://www.young.biz/ This test is not yet ap proved or cleared by the [place] FDA and  has been authorized for detection and/or diagnosis of SARS-CoV-2 by FDA under an Emergency Use Authorization (EUA). This EUA will remain  in effect (meaning this test can be used) for the duration of the COVID-19 declaration under Section 564(b)(1) of the Act, 21 U.S.C. section 360bbb-3(b)(1), unless the authorization is terminated or revoked sooner.    Influenza A by PCR NEGATIVE NEGATIVE   Influenza B by PCR NEGATIVE NEGATIVE    Comment: (NOTE) The Xpert Xpress SARS-CoV-2/FLU/RSV assay is intended as an aid in  the diagnosis of influenza from Nasopharyngeal swab specimens and  should not be used as a sole basis for treatment. Nasal washings and  aspirates are unacceptable for Xpert Xpress SARS-CoV-2/FLU/RSV  testing. Fact Sheet for Patients: https://www.moore.com/ Fact Sheet for Healthcare  Providers: https://www.young.biz/ This test is not yet approved or cleared by the [place] and  has been authorized for detection and/or diagnosis of SARS-CoV-2 by  FDA under an Emergency Use Authorization (EUA). This EUA will remain  in effect (meaning this test can be used) for the duration of the  Covid-19 declaration under Section 564(b)(1) of the Act, 21  U.S.C. section  360bbb-3(b)(1), unless the authorization is  terminated or revoked. Performed at [facility], [address] [place], [place] [address]   MRSA PCR Screening     Status: None   Collection Time: 02/09/20 11:53 PM   Specimen: Nasal Mucosa; Nasopharyngeal  Result Value Ref Range   MRSA by PCR NEGATIVE NEGATIVE    Comment:        The GeneXpert MRSA Assay (FDA approved for NASAL specimens only), is one component of a comprehensive MRSA colonization surveillance program. It is not intended to diagnose MRSA infection nor to guide or monitor treatment for MRSA infections. Performed at [facility], [address], [place], [place] [address]   CBC     Status: Abnormal   Collection Time: 02/10/20  2:57 AM  Result Value Ref Range   WBC 10.0 4.0 - 10.5 K/uL   RBC 4.68 3.87 - 5.11 MIL/uL   Hemoglobin 13.7 12.0 - 15.0 g/dL   HCT 21.3 08.6 - 57.8 %   MCV 91.0 80.0 - 100.0 fL   MCH 29.3 26.0 - 34.0 pg   MCHC 32.2 30.0 - 36.0 g/dL   RDW 46.9 (H) 62.9 - 52.8 %   Platelets 164 150 - 400 K/uL   nRBC 0.0 0.0 - 0.2 %    Comment: Performed at [facility], [address], [place], [place] [address]  Comprehensive metabolic panel     Status: Abnormal   Collection Time: 02/10/20  2:57 AM  Result Value Ref Range   Sodium 139 135 - 145 mmol/L   Potassium 4.1 3.5 - 5.1 mmol/L   Chloride 100 98 - 111 mmol/L   CO2 25 22 - 32 mmol/L   Glucose, Bld 161 (H) 70 - 99 mg/dL    Comment: Glucose reference range applies only to samples taken after fasting for at least 8 hours.   BUN 63 (H) 8 - 23 mg/dL   Creatinine, Ser 4.01 (H) 0.44 - 1.00 mg/dL   Calcium 8.4 (L) 8.9 - 10.3 mg/dL   Total Protein 6.9 6.5 - 8.1 g/dL   Albumin 3.5 3.5 - 5.0 g/dL   AST 26 15 - 41 U/L   ALT 20 0 - 44 U/L   Alkaline Phosphatase 41 38 - 126 U/L   Total Bilirubin 1.4 (H) 0.3 - 1.2 mg/dL   GFR calc non Af Amer 22 (L) >60 mL/min   GFR calc Af Amer 25 (L) >60 mL/min   Anion gap 14 5 - 15    Comment: Performed at [facility], [address], [place], [place] [address]  Lactic acid, plasma     Status: Abnormal   Collection Time: 02/10/20  2:57 AM  Result Value Ref Range   Lactic Acid, Venous 4.2 (HH) 0.5 - 1.9 mmol/L    Comment: CRITICAL VALUE NOTED.  VALUE IS CONSISTENT WITH PREVIOUSLY REPORTED AND CALLED VALUE. Performed at [facility], [address] [place], [place] [address]   Lactic acid, plasma     Status: Abnormal   Collection Time: 02/10/20  6:40 AM  Result Value Ref Range   Lactic Acid, Venous  2.7 (HH) 0.5 - 1.9 mmol/L    Comment: CRITICAL VALUE NOTED.  VALUE IS CONSISTENT WITH PREVIOUSLY REPORTED AND CALLED VALUE. Performed at [facility], [address], [place], [place] [address]     CT ABDOMEN PELVIS WO CONTRAST  Result Date: 02/09/2020 CLINICAL DATA:  Weak and lethargic right-sided pain EXAM: CT ABDOMEN AND PELVIS WITHOUT CONTRAST TECHNIQUE: Multidetector CT imaging of the abdomen and pelvis was performed following the standard protocol without IV contrast. COMPARISON:  Ultrasound 09/02/2017 FINDINGS: Lower chest: Lung bases demonstrate no acute consolidation or pleural effusion. Mild bronchiectasis in the lower lobes. Mild cardiomegaly. Hepatobiliary: No focal hepatic abnormality. No calcified gallstone or biliary dilatation Pancreas: Unremarkable. No pancreatic ductal dilatation or surrounding inflammatory changes. Spleen: Normal in size without focal abnormality. Adrenals/Urinary Tract: Adrenal glands are normal. Kidneys show no hydronephrosis. Cyst in the lower pole of the right kidney measuring 3.6 cm. The bladder is unremarkable Stomach/Bowel: Moderate fluid distension of the stomach. Diffuse fluid distension of the small bowel measuring up to 3.3 cm. Fecalized segment of distal small bowel in the pelvis. Nondistended colon filled with stool. Suspected transition point in the pelvis slightly to the right of midline, though limited details due to absence of contrast, lack of intra-abdominal fat,  and the presence of ascites. No bowel wall thickening. No intramural air. Negative appendix. Vascular/Lymphatic: Extensive aortic atherosclerosis. No aneurysm. No suspicious adenopathy Reproductive: Status post hysterectomy.  No adnexal mass. Other: No free air. Small amount of ascites within the abdomen and pelvis. Slight increase densities within posterior pelvic ascites fluid. Musculoskeletal: Degenerative changes with grade 1 anterolisthesis L5 on S1. No acute osseous abnormality. IMPRESSION: 1. Findings consistent with mechanical small bowel obstruction. Suspect that the transition point is in the terminal ileal region, slightly to the right of midline in the pelvis though details are limited by the absence of enteral contrast, paucity of fat in the region and presence of ascites. 2. Small amount of abdominal and pelvic ascites. Small amount high density ascites in the posterior pelvis, could be hemorrhagic or proteinaceous. Electronically Signed   By: Jasmine Pang M.D.   On: 02/09/2020 21:04   DG Chest Port 1 View  Result Date: 02/10/2020 CLINICAL DATA:  NG tube repositioning EXAM: PORTABLE CHEST 1 VIEW COMPARISON:  February 09, 2020 11:44 p.m. FINDINGS: Again noted is cardiomegaly. Aortic knob calcifications. There is interval repositioning of the NG tube which is not seen below the diaphragm within the proximal stomach. There is mildly dilated air-filled loops of small bowel within the right upper quadrant. The lungs are clear. No focal airspace consolidation. No acute osseous abnormality. IMPRESSION: *Interval repositioning of the NG tube within the proximal stomach. *Mildly dilated air-filled loops of small bowel in the right upper quadrant. Electronically Signed   By: Jonna Clark M.D.   On: 02/10/2020 03:11   DG Chest Portable 1 View  Result Date: 02/09/2020 CLINICAL DATA:  Weakness EXAM: PORTABLE CHEST 1 VIEW COMPARISON:  11/19/2019 FINDINGS: Two small nodular opacities overlying the right mid  lung. No new consolidation or edema. No pleural effusion or pneumothorax. Stable cardiomediastinal contours with normal heart size IMPRESSION: Two small nodular opacities overlying the right mid lung. Chest CT could be considered as indicated in this patient. Electronically Signed   By: Guadlupe Spanish M.D.   On: 02/09/2020 19:24   DG Chest Port 1V same Day  Result Date: 02/10/2020 CLINICAL DATA:  NG tube placement. EXAM: PORTABLE CHEST 1 VIEW COMPARISON:  Chest radiograph and abdominal  CT earlier this day. FINDINGS: The enteric tube is kinked at the gastroesophageal junction with the tip directed cranially in the distal esophagus. Recommend repositioning. Low lung volumes. No acute airspace disease. Gaseous distention of bowel in the upper abdomen. IMPRESSION: The enteric tube is kinked at the gastroesophageal junction with the tip directed cranially in the distal esophagus. Recommend repositioning. Electronically Signed   By: Narda Rutherford M.D.   On: 02/10/2020 00:00    ROS:  Review of systems not obtained due to patient factors.  Blood pressure 107/77, pulse 89, temperature 98.2 F (36.8 C), temperature source Oral, resp. rate (!) 28, weight 42.2 kg, SpO2 93 %. Physical Exam: Frail black female no acute distress. Head is normocephalic, atraumatic Lungs are clear to auscultation with good breath sounds bilaterally Heart examination reveals atrial flutter with regular rate Abdomen is soft but distended.  It is not tense.  No hernias are palpable.  No rigidity is noted.  No point tenderness is noted.  CT scan images personally reviewed Cardiology note reviewed  Assessment/Plan: Impression: Partial small bowel obstruction.  Patient does not appear to have an acute abdomen.  I suspect that with her decreased p.o. intake and emesis, she became dehydrated as evidenced by her soft blood pressures, elevated lactic acid level, and renal insufficiency.  She does have atherosclerotic disease of the  aorta, but I do not see evidence of ischemic bowel at this time. At this point, I would like to try to treat this conservatively without surgical intervention.  She is at increased risk for operative complications including cardiolpulomary difficulties, stroke, MI, and even death.  At this point, I do not think she would tolerate general anesthesia.  This was discussed with Dr. Laural Benes.  It is noted that she has refused further intervention for her atrial flutter.  We will continue NG tube decompression.  Will write for enema.  Will follow with you.  Franky Macho 02/10/2020, 10:21 AM

## 2020-02-10 NOTE — Progress Notes (Signed)
Pt had not voided all shift. Bladder scan revealed in bladder. I&O done per protocol amber color foul odor urine returned.

## 2020-02-11 ENCOUNTER — Ambulatory Visit: Payer: Medicare HMO | Admitting: Podiatry

## 2020-02-11 ENCOUNTER — Inpatient Hospital Stay (HOSPITAL_COMMUNITY): Payer: Medicare HMO

## 2020-02-11 DIAGNOSIS — E872 Acidosis: Secondary | ICD-10-CM

## 2020-02-11 DIAGNOSIS — E785 Hyperlipidemia, unspecified: Secondary | ICD-10-CM

## 2020-02-11 LAB — BASIC METABOLIC PANEL
Anion gap: 13 (ref 5–15)
BUN: 65 mg/dL — ABNORMAL HIGH (ref 8–23)
CO2: 22 mmol/L (ref 22–32)
Calcium: 7.7 mg/dL — ABNORMAL LOW (ref 8.9–10.3)
Chloride: 107 mmol/L (ref 98–111)
Creatinine, Ser: 1.48 mg/dL — ABNORMAL HIGH (ref 0.44–1.00)
GFR calc Af Amer: 36 mL/min — ABNORMAL LOW (ref >60)
GFR calc non Af Amer: 31 mL/min — ABNORMAL LOW (ref >60)
Glucose, Bld: 142 mg/dL — ABNORMAL HIGH (ref 70–99)
Potassium: 3.4 mmol/L — ABNORMAL LOW (ref 3.5–5.1)
Sodium: 142 mmol/L (ref 135–145)

## 2020-02-11 LAB — CBC
HCT: 41.3 % (ref 36.0–46.0)
Hemoglobin: 13.1 g/dL (ref 12.0–15.0)
MCH: 28.9 pg (ref 26.0–34.0)
MCHC: 31.7 g/dL (ref 30.0–36.0)
MCV: 91 fL (ref 80.0–100.0)
Platelets: 145 10*3/uL — ABNORMAL LOW (ref 150–400)
RBC: 4.54 MIL/uL (ref 3.87–5.11)
RDW: 16.9 % — ABNORMAL HIGH (ref 11.5–15.5)
WBC: 7.9 10*3/uL (ref 4.0–10.5)
nRBC: 0 % (ref 0.0–0.2)

## 2020-02-11 LAB — HEPARIN LEVEL (UNFRACTIONATED): Heparin Unfractionated: 1.56 IU/mL — ABNORMAL HIGH (ref 0.30–0.70)

## 2020-02-11 LAB — APTT: aPTT: 88 seconds — ABNORMAL HIGH (ref 24–36)

## 2020-02-11 LAB — LACTIC ACID, PLASMA
Lactic Acid, Venous: 2.8 mmol/L (ref 0.5–1.9)
Lactic Acid, Venous: 3.1 mmol/L (ref 0.5–1.9)

## 2020-02-11 LAB — TSH: TSH: 1.059 u[IU]/mL (ref 0.350–4.500)

## 2020-02-11 LAB — MAGNESIUM: Magnesium: 2 mg/dL (ref 1.7–2.4)

## 2020-02-11 MED ORDER — MORPHINE SULFATE 10 MG/5ML PO SOLN: 5.0000 mg | ORAL | 0 refills | Status: AC | PRN

## 2020-02-11 MED ORDER — AMIODARONE HCL IN DEXTROSE 360-4.14 MG/200ML-% IV SOLN
60.0000 mg/h | INTRAVENOUS | Status: DC
  Administered 2020-02-11: 60 mg/h via INTRAVENOUS
  Filled 2020-02-11: qty 200

## 2020-02-11 MED ORDER — SODIUM CHLORIDE 0.9 % IV BOLUS
1000.0000 mL | Freq: Once | INTRAVENOUS | Status: AC
  Administered 2020-02-11: 1000 mL via INTRAVENOUS

## 2020-02-11 MED ORDER — AMIODARONE LOAD VIA INFUSION
150.0000 mg | Freq: Once | INTRAVENOUS | Status: AC
  Administered 2020-02-11: 150 mg via INTRAVENOUS
  Filled 2020-02-11: qty 83.34

## 2020-02-11 MED ORDER — MILK AND MOLASSES ENEMA: 1.0000 | Freq: Once | RECTAL | Status: DC

## 2020-02-11 MED ORDER — AMIODARONE HCL IN DEXTROSE 360-4.14 MG/200ML-% IV SOLN: 30.0000 mg/h | INTRAVENOUS | Status: DC

## 2020-02-11 MED ORDER — SODIUM CHLORIDE 0.9 % IV BOLUS: 500.0000 mL | Freq: Once | INTRAVENOUS | Status: DC

## 2020-02-11 MED ORDER — LORAZEPAM 2 MG/ML PO CONC: 0.6000 mg | Freq: Three times a day (TID) | ORAL | 0 refills | Status: AC | PRN

## 2020-02-11 NOTE — Progress Notes (Signed)
ANTICOAGULATION CONSULT NOTE -  Pharmacy Consult for Heparin Indication: atrial flutter  No Known Allergies  Patient Measurements: Weight: 42.2 kg (93 lb 0.6 oz) Heparin Dosing Weight: 40.8 kg  Vital Signs: Temp: 97.7 F (36.5 C) (04/30 0800) Temp Source: Oral (04/30 0800) BP: 90/65 (04/30 0745) Pulse Rate: 109 (04/30 0745)  Labs: Recent Labs    02/09/20 1907 02/09/20 1907 02/09/20 2127 02/09/20 2127 02/10/20 0257 02/10/20 0931 02/10/20 2001 02/11/20 0429  HGB 15.5*   < >  --   --  13.7  --   --  13.1  HCT 48.7*  --   --   --  42.6  --   --  41.3  PLT 183  --   --   --  164  --   --  145*  APTT  --   --  28   < >  --  108* 80* 88*  LABPROT  --   --  14.4  --   --   --   --   --   INR  --   --  1.2  --   --   --   --   --   HEPARINUNFRC  --   --  1.82*  --   --   --   --  1.56*  CREATININE 2.19*  --   --   --  1.96*  --   --  1.48*   < > = values in this interval not displayed.    Estimated Creatinine Clearance: 16.5 mL/min (A) (by C-G formula based on SCr of 1.48 mg/dL (H)).   Medical History: Past Medical History:  Diagnosis Date  . Atrial flutter (H[facility])   . Hyperlipidemia   . Hypertension     Medications:  Medications Prior to Admission  Medication Sig Dispense Refill Last Dose  . acetaminophen (TYLENOL) 500 MG tablet Take 500 mg by mouth every 6 (six) hours as needed for mild pain or moderate pain.   02/08/2020 at Unknown time  . apixaban (ELIQUIS) 2.5 MG TABS tablet Take 1 tablet (2.5 mg total) by mouth 2 (two) times daily. 56 tablet 0 02/08/2020 at 2100  . benazepril-hydrochlorthiazide (LOTENSIN HCT) 20-25 MG tablet Take 1 tablet by mouth daily.    02/08/2020 at Unknown time  . donepezil (ARICEPT) 5 MG tablet Take 5 mg by mouth daily.   02/08/2020 at Unknown time  . metoprolol succinate (TOPROL XL) 25 MG 24 hr tablet Take 1 tablet (25 mg total) by mouth 2 (two) times daily. (Patient taking differently: Take 25 mg by mouth in the morning and at bedtime. ) 60  tablet 6 02/08/2020 at 2100  . rosuvastatin (CRESTOR) 5 MG tablet Take 5 mg by mouth daily.    02/08/2020 at Unknown time    Assessment: 86 y/o F with a h/o atrial flutter on apixaban with last dose 4/27 (time unknown) admitted with generalized weakness. Pharmacy consulted to initiate heparin infusion while holding apixaban. Patient with SBO. HL elevated at 1.56, not correlating with APTT.  APTT therapeutic at 88 No bleeding or infusion issues noted  Goal of Therapy:  Heparin level 0.3-0.7 units/ml APTT 66-102 sec Monitor platelets by anticoagulation protocol: Yes   Plan:  Continue Heparin infusion at  500 units/hr Daily CBC/aPTT/HL until aPTT and HL correlate Monitor for s/s of bleeding  Elder Cyphers, BS Loura Back, BCPS Clinical Pharmacist Pager [phone] 02/11/2020,8:33 AM

## 2020-02-11 NOTE — Care Management Important Message (Signed)
Important Message  Patient Details  Name: Beth Terry MRN: [id number] Date of Birth: 01-18-1927   Medicare Important Message Given:  Yes     Corey Harold 02/11/2020, 4:33 PM

## 2020-02-11 NOTE — Progress Notes (Addendum)
02/11/2020 12:33 PM  I had a long discussion with patient's daughter Toney Reil and I explained to her that he is not having any meaningful improvement and very poor surgical candidate.  Patient is requesting to go home.  Patient is terminally ill at this point and qualifies for hospice care.  She is agreeable to taking patient home with hospice care.  She says that patient would benefit from hospital bed.  I have reached out to the social worker and asked for home hospice arrangements to be made with the appropriate equipment needed and will focus on comfort care only.  This is the wishes of the patient and family.  Please see DC summary.  I have updated Dr. Lovell Sheehan the attending RN and surgeon who verbalize understanding and agreement.  Will DC the NG tube and cancel the scheduled enema.  Maryln Manuel MD.

## 2020-02-11 NOTE — Progress Notes (Signed)
Telemetry reviewed. Rates reasonable though some soft bp's, She is on dilt at 12.5 mg/hr, lopressor 2.5mg  every 6 hours.With soft bp's will d/c IV lopressor and diltiazem, start IV amiodarone. Plan would be for short course of IV/oral amiodarone, I think once she is out of the range of her acute illness will be able to get off amio and restart her home regimen of beta blocker.   Dina Rich MD

## 2020-02-11 NOTE — Progress Notes (Signed)
Patient ID: Beth Terry, female   DOB: 1926-11-11, 86 y.o.   MRN: [id number]    Subjective: Beth Terry was seen this morning and reports feeling alright. She does report continued abdominal pain that is sharp in nature and worse on her right side. However, she does mention that it is improving from yesterday. She had one hard bowel movement yesterday after enema. Today she was asking for cold water and expresses desire to go home.    Objective: Vital signs in last 24 hours: Temp:  [97.3 F (36.3 C)-98 F (36.7 C)] 97.7 F (36.5 C) (04/30 0800) Pulse Rate:  [60-123] 109 (04/30 0745) Resp:  [20-40] 34 (04/30 0745) BP: (80-120)/(58-88) 90/65 (04/30 0745) SpO2:  [86 %-98 %] 94 % (04/30 0745) Last BM Date: 02/10/20  Intake/Output from previous day: 04/29 0701 - 04/30 0700 In: 2025.2 [I.V.:2025.2] Out: 975 [Urine:925; Emesis/NG output:50] Intake/Output this shift: Total I/O In: 50.7 [I.V.:50.7] Out: -   Physical Exam Constitutional:      Appearance: She is ill-appearing.  Cardiovascular:     Rate and Rhythm: Tachycardia present. Rhythm irregular.  Pulmonary:     Effort: Pulmonary effort is normal.     Breath sounds: Normal breath sounds.  Abdominal:     General: Bowel sounds are decreased. There is distension.     Tenderness: There is generalized abdominal tenderness and tenderness in the right upper quadrant.  Neurological:     Mental Status: She is alert.    Lab Results:  Recent Labs    02/10/20 0257 02/11/20 0429  WBC 10.0 7.9  HGB 13.7 13.1  HCT 42.6 41.3  PLT 164 145*   BMET Recent Labs    02/10/20 0257 02/11/20 0429  NA 139 142  K 4.1 3.4*  CL 100 107  CO2 25 22  GLUCOSE 161* 142*  BUN 63* 65*  CREATININE 1.96* 1.48*  CALCIUM 8.4* 7.7*   PT/INR Recent Labs    02/09/20 2127  LABPROT 14.4  INR 1.2    Studies/Results: CT ABDOMEN PELVIS WO CONTRAST  Result Date: 02/09/2020 CLINICAL DATA:  Weak and lethargic right-sided pain EXAM: CT  ABDOMEN AND PELVIS WITHOUT CONTRAST TECHNIQUE: Multidetector CT imaging of the abdomen and pelvis was performed following the standard protocol without IV contrast. COMPARISON:  Ultrasound 09/02/2017 FINDINGS: Lower chest: Lung bases demonstrate no acute consolidation or pleural effusion. Mild bronchiectasis in the lower lobes. Mild cardiomegaly. Hepatobiliary: No focal hepatic abnormality. No calcified gallstone or biliary dilatation Pancreas: Unremarkable. No pancreatic ductal dilatation or surrounding inflammatory changes. Spleen: Normal in size without focal abnormality. Adrenals/Urinary Tract: Adrenal glands are normal. Kidneys show no hydronephrosis. Cyst in the lower pole of the right kidney measuring 3.6 cm. The bladder is unremarkable Stomach/Bowel: Moderate fluid distension of the stomach. Diffuse fluid distension of the small bowel measuring up to 3.3 cm. Fecalized segment of distal small bowel in the pelvis. Nondistended colon filled with stool. Suspected transition point in the pelvis slightly to the right of midline, though limited details due to absence of contrast, lack of intra-abdominal fat, and the presence of ascites. No bowel wall thickening. No intramural air. Negative appendix. Vascular/Lymphatic: Extensive aortic atherosclerosis. No aneurysm. No suspicious adenopathy Reproductive: Status post hysterectomy.  No adnexal mass. Other: No free air. Small amount of ascites within the abdomen and pelvis. Slight increase densities within posterior pelvic ascites fluid. Musculoskeletal: Degenerative changes with grade 1 anterolisthesis L5 on S1. No acute osseous abnormality. IMPRESSION: 1. Findings consistent with mechanical small bowel obstruction.  Suspect that the transition point is in the terminal ileal region, slightly to the right of midline in the pelvis though details are limited by the absence of enteral contrast, paucity of fat in the region and presence of ascites. 2. Small amount of  abdominal and pelvic ascites. Small amount high density ascites in the posterior pelvis, could be hemorrhagic or proteinaceous. Electronically Signed   By: Donavan Foil M.D.   On: 02/09/2020 21:04   DG Chest Port 1 View  Result Date: 02/10/2020 CLINICAL DATA:  NG tube repositioning EXAM: PORTABLE CHEST 1 VIEW COMPARISON:  February 09, 2020 11:44 p.m. FINDINGS: Again noted is cardiomegaly. Aortic knob calcifications. There is interval repositioning of the NG tube which is not seen below the diaphragm within the proximal stomach. There is mildly dilated air-filled loops of small bowel within the right upper quadrant. The lungs are clear. No focal airspace consolidation. No acute osseous abnormality. IMPRESSION: *Interval repositioning of the NG tube within the proximal stomach. *Mildly dilated air-filled loops of small bowel in the right upper quadrant. Electronically Signed   By: Prudencio Pair M.D.   On: 02/10/2020 03:11   DG Chest Portable 1 View  Result Date: 02/09/2020 CLINICAL DATA:  Weakness EXAM: PORTABLE CHEST 1 VIEW COMPARISON:  11/19/2019 FINDINGS: Two small nodular opacities overlying the right mid lung. No new consolidation or edema. No pleural effusion or pneumothorax. Stable cardiomediastinal contours with normal heart size IMPRESSION: Two small nodular opacities overlying the right mid lung. Chest CT could be considered as indicated in this patient. Electronically Signed   By: Macy Mis M.D.   On: 02/09/2020 19:24   DG Chest Port 1V same Day  Result Date: 02/10/2020 CLINICAL DATA:  NG tube placement. EXAM: PORTABLE CHEST 1 VIEW COMPARISON:  Chest radiograph and abdominal CT earlier this day. FINDINGS: The enteric tube is kinked at the gastroesophageal junction with the tip directed cranially in the distal esophagus. Recommend repositioning. Low lung volumes. No acute airspace disease. Gaseous distention of bowel in the upper abdomen. IMPRESSION: The enteric tube is kinked at the  gastroesophageal junction with the tip directed cranially in the distal esophagus. Recommend repositioning. Electronically Signed   By: Keith Rake M.D.   On: 02/10/2020 00:00   DG ABD ACUTE 2+V W 1V CHEST  Result Date: 02/11/2020 CLINICAL DATA:  Small bowel obstruction, sepsis EXAM: DG ABDOMEN ACUTE W/ 1V CHEST COMPARISON:  02/09/2020 CT abdomen/pelvis FINDINGS: Enteric tube terminates in body of the stomach. Diffuse small bowel dilatation up to 4.5 cm diameter, not substantially changed. No evidence of pneumatosis or pneumoperitoneum. Mild colonic stool. Marked lumbar spondylosis. IMPRESSION: 1. Diffuse small bowel dilatation compatible with distal small-bowel obstruction, not substantially changed. 2. Enteric tube terminates in the body of the stomach. Electronically Signed   By: Ilona Sorrel M.D.   On: 02/11/2020 09:12    Anti-infectives: Anti-infectives (From admission, onward)   None      Assessment/Plan: Beth Terry is a 86 year old female with a history of A flutter on anticoagulation admitted with small bowel obstruction, a fib with RVR and lactic acidosis. At this point there are no signs of an acute abdomen. Her abdomen  Is not rigid and there are no peritoneal signs. She does have significant tenderness to palpation on exam as well as persistently increased lactic acid levels and low blood pressure raising concern for possible ischemia to the bowels. However, given her age and multiple comorbidities she is unlikely to tolerate general anesthesia. Additionally  the patient and her family do no wish to pursue any surgical intervention even if NG tube decompression of her obstruction is unsuccessful. - continue NG tube decompression  - no surgical intervention at this time     LOS: 2 days    Norton Blizzard 02/11/2020

## 2020-02-11 NOTE — Discharge Summary (Signed)
Physician Discharge Summary  Beth Terry ACZ:[id number] DOB: Feb 18, 1927 DOA: 02/09/2020  PCP: Avon Gully, MD  Admit date: 02/09/2020 Discharge date: 02/11/2020  Admitted From:  Home  Disposition: Home with Hospice   Discharge Condition: HOSPICE   CODE STATUS: DNR     OUTPATIENT RECOMMENDATIONS Symptom management per hospice protocols  Brief Hospitalization Summary: Please see all hospital notes, images, labs for full details of the hospitalization. ADMISSION HPI:  Beth Terry  is a 86 y.o. female, with history of hyperlipidemia, hypertension, atrial flutter on anticoagulation who presented to ED with complaints of generalized weakness and fatigue.  As per family patient had not been eating and drinking well for past 4 to 5 days.  Also complained of pain to the right side of her abdomen.  She had a episode of vomiting yesterday but no diarrhea.  No history of fever, cough chest pain or shortness of breath.  At this time patient denies any pain, she did receive 1 dose of IV fentanyl in the ED  In the ED, CT scan of the abdomen and pelvis was done which showed small bowel obstruction.  With transition point in the terminal ileal region.  Also patient found to be in A. fib with RVR, started on IV Cardizem.  Brief Narrative:  86 y.o.female,with history of hyperlipidemia, hypertension, atrial flutter on anticoagulation who presented to ED with complaints of generalized weakness and fatigue and found to have a small bowel obstruction and atrial flutter with RVR.      Assessment & Plan:   Principal Problem:   SBO (small bowel obstruction) (HCC) Active Problems:   Atrial flutter with rapid ventricular response (HCC)   Lactic acidosis   Atrial flutter (HCC)   Coagulopathy (HCC)   AKI (acute kidney injury) (HCC)   Dehydration   Hypertension   Hyperlipidemia  1. Atrial flutter with rapid ventricular response-patient had initially responded well to IV Cardizem infusion  and scheduled IV metoprolol 2.5 every 6 hours however her blood pressures have intermittently been soft and she was taken off of the IV metoprolol and Cardizem and transitioned over to an amiodarone infusion by the cardiology service.  After family made decision to transition to full comfort care amiodarone infusion was discontinued we will continue oral home Toprol XL. 2. SBO-unfortunately with supportive therapies the patient is not making any meaningful recovery and continues to have elevated lactic acid levels abdominal pain and she is not a surgical candidate and family would not consent to surgery at this time.  The patient is terminally ill at this time and I have spoke with family members and the decision was made for patient to return home with hospice care and to focus on full comfort measures.  They understand that the patient is terminally ill and likely has days.  Arrangements are being made with [facility] and hospital bed being arranged for patient. 3. AKI-secondary to dehydration treated with IV fluid hydration. 4. Coagulopathy-not resume apixaban as patient is now full comfort care. 5. Lactic acidosis-initially trended down with hydration however remains elevated and likely secondary to abdominal process which is a poor prognostic indicator and likely terminal event. 6. Abdominal pain -secondary to bowel obstruction, now the patient is full comfort care have ordered for oral morphine to be given as needed for pain and lorazepam oral concentrated solution to use for anxiety.  [facility] hospice will further manage.  DVT prophylaxis: IV heparin Code Status:  DNR  Family Communication:  I spoke with  the patient's daughter [name] and we have a long discussion regarding goals of care and patient's desire to return home.   Disposition: Home with hospice, full comfort care  Discharge Diagnoses:  Principal Problem:   SBO (small bowel obstruction) ([place]) Active  Problems:   Atrial flutter with rapid ventricular response (HCC)   Lactic acidosis   Atrial flutter (HCC)   Coagulopathy (HCC)   AKI (acute kidney injury) ([place])   Dehydration   Hypertension   Hyperlipidemia   Goals of care, counseling/discussion   Palliative care by specialist   Encounter for hospice care discussion  Discharge Instructions:  Allergies as of 02/11/2020   No Known Allergies     Medication List    STOP taking these medications   apixaban 2.5 MG Tabs tablet Commonly known as: ELIQUIS   benazepril-hydrochlorthiazide 20-25 MG tablet Commonly known as: LOTENSIN HCT   donepezil 5 MG tablet Commonly known as: ARICEPT   rosuvastatin 5 MG tablet Commonly known as: CRESTOR     TAKE these medications   acetaminophen 500 MG tablet Commonly known as: TYLENOL Take 500 mg by mouth every 6 (six) hours as needed for mild pain or moderate pain.   LORazepam 2 MG/ML concentrated solution Commonly known as: ATIVAN Take 0.3 mLs (0.6 mg total) by mouth every 8 (eight) hours as needed for anxiety.   metoprolol succinate 25 MG 24 hr tablet Commonly known as: Toprol XL Take 1 tablet (25 mg total) by mouth 2 (two) times daily. What changed: when to take this   morphine 10 MG/5ML solution Take 2.5 mLs (5 mg total) by mouth every 2 (two) hours as needed for severe pain.       No Known Allergies Allergies as of 02/11/2020   No Known Allergies     Medication List    STOP taking these medications   apixaban 2.5 MG Tabs tablet Commonly known as: ELIQUIS   benazepril-hydrochlorthiazide 20-25 MG tablet Commonly known as: LOTENSIN HCT   donepezil 5 MG tablet Commonly known as: ARICEPT   rosuvastatin 5 MG tablet Commonly known as: CRESTOR     TAKE these medications   acetaminophen 500 MG tablet Commonly known as: TYLENOL Take 500 mg by mouth every 6 (six) hours as needed for mild pain or moderate pain.   LORazepam 2 MG/ML concentrated solution Commonly known  as: ATIVAN Take 0.3 mLs (0.6 mg total) by mouth every 8 (eight) hours as needed for anxiety.   metoprolol succinate 25 MG 24 hr tablet Commonly known as: Toprol XL Take 1 tablet (25 mg total) by mouth 2 (two) times daily. What changed: when to take this   morphine 10 MG/5ML solution Take 2.5 mLs (5 mg total) by mouth every 2 (two) hours as needed for severe pain.       Procedures/Studies: CT ABDOMEN PELVIS WO CONTRAST  Result Date: 02/09/2020 CLINICAL DATA:  Weak and lethargic right-sided pain EXAM: CT ABDOMEN AND PELVIS WITHOUT CONTRAST TECHNIQUE: Multidetector CT imaging of the abdomen and pelvis was performed following the standard protocol without IV contrast. COMPARISON:  Ultrasound 09/02/2017 FINDINGS: Lower chest: Lung bases demonstrate no acute consolidation or pleural effusion. Mild bronchiectasis in the lower lobes. Mild cardiomegaly. Hepatobiliary: No focal hepatic abnormality. No calcified gallstone or biliary dilatation Pancreas: Unremarkable. No pancreatic ductal dilatation or surrounding inflammatory changes. Spleen: Normal in size without focal abnormality. Adrenals/Urinary Tract: Adrenal glands are normal. Kidneys show no hydronephrosis. Cyst in the lower pole of the right kidney measuring 3.6 cm. The  bladder is unremarkable Stomach/Bowel: Moderate fluid distension of the stomach. Diffuse fluid distension of the small bowel measuring up to 3.3 cm. Fecalized segment of distal small bowel in the pelvis. Nondistended colon filled with stool. Suspected transition point in the pelvis slightly to the right of midline, though limited details due to absence of contrast, lack of intra-abdominal fat, and the presence of ascites. No bowel wall thickening. No intramural air. Negative appendix. Vascular/Lymphatic: Extensive aortic atherosclerosis. No aneurysm. No suspicious adenopathy Reproductive: Status post hysterectomy.  No adnexal mass. Other: No free air. Small amount of ascites within the  abdomen and pelvis. Slight increase densities within posterior pelvic ascites fluid. Musculoskeletal: Degenerative changes with grade 1 anterolisthesis L5 on S1. No acute osseous abnormality. IMPRESSION: 1. Findings consistent with mechanical small bowel obstruction. Suspect that the transition point is in the terminal ileal region, slightly to the right of midline in the pelvis though details are limited by the absence of enteral contrast, paucity of fat in the region and presence of ascites. 2. Small amount of abdominal and pelvic ascites. Small amount high density ascites in the posterior pelvis, could be hemorrhagic or proteinaceous. Electronically Signed   By: Jasmine Pang M.D.   On: 02/09/2020 21:04   DG Chest Port 1 View  Result Date: 02/10/2020 CLINICAL DATA:  NG tube repositioning EXAM: PORTABLE CHEST 1 VIEW COMPARISON:  February 09, 2020 11:44 p.m. FINDINGS: Again noted is cardiomegaly. Aortic knob calcifications. There is interval repositioning of the NG tube which is not seen below the diaphragm within the proximal stomach. There is mildly dilated air-filled loops of small bowel within the right upper quadrant. The lungs are clear. No focal airspace consolidation. No acute osseous abnormality. IMPRESSION: *Interval repositioning of the NG tube within the proximal stomach. *Mildly dilated air-filled loops of small bowel in the right upper quadrant. Electronically Signed   By: Jonna Clark M.D.   On: 02/10/2020 03:11   DG Chest Portable 1 View  Result Date: 02/09/2020 CLINICAL DATA:  Weakness EXAM: PORTABLE CHEST 1 VIEW COMPARISON:  11/19/2019 FINDINGS: Two small nodular opacities overlying the right mid lung. No new consolidation or edema. No pleural effusion or pneumothorax. Stable cardiomediastinal contours with normal heart size IMPRESSION: Two small nodular opacities overlying the right mid lung. Chest CT could be considered as indicated in this patient. Electronically Signed   By: Guadlupe Spanish M.D.   On: 02/09/2020 19:24   DG Chest Port 1V same Day  Result Date: 02/10/2020 CLINICAL DATA:  NG tube placement. EXAM: PORTABLE CHEST 1 VIEW COMPARISON:  Chest radiograph and abdominal CT earlier this day. FINDINGS: The enteric tube is kinked at the gastroesophageal junction with the tip directed cranially in the distal esophagus. Recommend repositioning. Low lung volumes. No acute airspace disease. Gaseous distention of bowel in the upper abdomen. IMPRESSION: The enteric tube is kinked at the gastroesophageal junction with the tip directed cranially in the distal esophagus. Recommend repositioning. Electronically Signed   By: Narda Rutherford M.D.   On: 02/10/2020 00:00   DG ABD ACUTE 2+V W 1V CHEST  Result Date: 02/11/2020 CLINICAL DATA:  Small bowel obstruction, sepsis EXAM: DG ABDOMEN ACUTE W/ 1V CHEST COMPARISON:  02/09/2020 CT abdomen/pelvis FINDINGS: Enteric tube terminates in body of the stomach. Diffuse small bowel dilatation up to 4.5 cm diameter, not substantially changed. No evidence of pneumatosis or pneumoperitoneum. Mild colonic stool. Marked lumbar spondylosis. IMPRESSION: 1. Diffuse small bowel dilatation compatible with distal small-bowel obstruction, not substantially changed. 2.  Enteric tube terminates in the body of the stomach. Electronically Signed   By: Delbert Phenix M.D.   On: 02/11/2020 09:12   VAS [place] LOWER EXT ART SEG MULTI (SEGMENTALS & LE RAYNAUDS)  Result Date: 01/13/2020 LOWER EXTREMITY DOPPLER STUDY Indications: Claudication, and Decreased pulses. Other Factors: Patient complains of pain in her toenails and feet with                ambulation and at rest that has been going on for 3 months.  Performing Technologist: McFatter, Angelica Chessman RVT  Examination Guidelines: A complete evaluation includes at minimum, Doppler waveform signals and systolic blood pressure reading at the level of bilateral brachial, anterior tibial, and posterior tibial arteries, when vessel segments  are accessible. Bilateral testing is considered an integral part of a complete examination. Photoelectric Plethysmograph (PPG) waveforms and toe systolic pressure readings are included as required and additional duplex testing as needed. Limited examinations for reoccurring indications may be performed as noted.  ABI Findings: +---------+------------------+-----+---------+--------+ Right    Rt Pressure (mmHg)IndexWaveform Comment  +---------+------------------+-----+---------+--------+ Brachial 131                    biphasic          +---------+------------------+-----+---------+--------+ CFA                             triphasic         +---------+------------------+-----+---------+--------+ Popliteal                       triphasic         +---------+------------------+-----+---------+--------+ ATA      159               1.20 triphasic         +---------+------------------+-----+---------+--------+ PTA      181               1.36 triphasic         +---------+------------------+-----+---------+--------+ PERO     162               1.22 triphasic         +---------+------------------+-----+---------+--------+ Great Toe0                 0.00 Abnormal          +---------+------------------+-----+---------+--------+ +---------+------------------+-----+---------+-------+ Left     Lt Pressure (mmHg)IndexWaveform Comment +---------+------------------+-----+---------+-------+ Brachial 133                    biphasic         +---------+------------------+-----+---------+-------+ CFA                             triphasic        +---------+------------------+-----+---------+-------+ Popliteal                       triphasic        +---------+------------------+-----+---------+-------+ ATA      146               1.10 triphasic        +---------+------------------+-----+---------+-------+ PTA      144               1.08 triphasic         +---------+------------------+-----+---------+-------+ PERO     132  0.99 triphasic        +---------+------------------+-----+---------+-------+ Great Toe85                0.64 Abnormal         +---------+------------------+-----+---------+-------+ TOES Findings: +----------+---------------+--------+-------+ Right ToesPressure (mmHg)WaveformComment +----------+---------------+--------+-------+ 1st Digit                Abnormal        +----------+---------------+--------+-------+ 2nd Digit                Abnormal        +----------+---------------+--------+-------+ 3rd Digit                Abnormal        +----------+---------------+--------+-------+ 4th Digit                Abnormal        +----------+---------------+--------+-------+ 5th Digit                Abnormal        +----------+---------------+--------+-------+ Patient had a tremor in her right foot along with an irregular cardiac cycle. +---------+---------------+--------+-------+ Left ToesPressure (mmHg)WaveformComment +---------+---------------+--------+-------+ 1st Digit               Abnormal        +---------+---------------+--------+-------+ 2nd Digit               Abnormal        +---------+---------------+--------+-------+ 3rd Digit               Abnormal        +---------+---------------+--------+-------+ 4th Digit               Abnormal        +---------+---------------+--------+-------+ 5th Digit               Abnormal        +---------+---------------+--------+-------+ Patient had hard time holding foot still along with an irregular cardiac cycle   Summary: Right: Resting right ankle-brachial index is within normal range. No evidence of significant right lower extremity arterial disease. Unable to obtain right toe pressure. Left: Resting left ankle-brachial index is within normal range. No evidence of significant left lower extremity arterial disease. The left  toe-brachial index is abnormal.  *See table(s) above for measurements and observations.  Electronically signed by Charlton Haws MD on 01/13/2020 at 8:02:54 AM.    Final       Subjective: Patient telling the RN and reporting to me that she would like to go home.  She does not want to be in the hospital at this time  Discharge Exam: Vitals:   02/11/20 1230 02/11/20 1245  BP: 96/70 95/66  Pulse: (!) 108   Resp: (!) 39 (!) 30  Temp:    SpO2: 93% 93%   Vitals:   02/11/20 1200 02/11/20 1215 02/11/20 1230 02/11/20 1245  BP: 93/69 (!) 87/69 96/70 95/66   Pulse: (!) 112  (!) 108   Resp: (!) 33 (!) 36 (!) 39 (!) 30  Temp: 97.7 F (36.5 C)     TempSrc: Oral     SpO2: 94%  93% 93%  Weight:       General: Pt is alert, awake, not in acute distress Cardiovascular: tachycardic, S1/S2 +, no rubs, no gallops Respiratory: CTA bilaterally, no wheezing, no rhonchi Abdominal: very hard and tender, minimal bowel sounds Extremities: no edema, no cyanosis    The results of significant diagnostics from this hospitalization (including imaging, microbiology, ancillary and laboratory) are listed below  for reference.     Microbiology: Recent Results (from the past 240 hour(s))  Blood culture (routine x 2)     Status: None (Preliminary result)   Collection Time: 02/09/20  9:24 PM   Specimen: BLOOD LEFT ARM  Result Value Ref Range Status   Specimen Description BLOOD LEFT ARM  Final   Special Requests   Final    BOTTLES DRAWN AEROBIC AND ANAEROBIC Blood Culture adequate volume   Culture   Final    NO GROWTH 2 DAYS Performed at [facility], [address], [place], [place] [address]    Report Status PENDING  Incomplete  Blood culture (routine x 2)     Status: None (Preliminary result)   Collection Time: 02/09/20  9:27 PM   Specimen: BLOOD LEFT HAND  Result Value Ref Range Status   Specimen Description BLOOD LEFT HAND  Final   Special Requests   Final    BOTTLES DRAWN AEROBIC AND ANAEROBIC Blood  Culture adequate volume   Culture   Final    NO GROWTH 2 DAYS Performed at [facility], [address] [place], [place] [address]    Report Status PENDING  Incomplete  Respiratory Panel by RT PCR (Flu A&B, Covid) - Nasopharyngeal Swab     Status: None   Collection Time: 02/09/20  9:29 PM   Specimen: Nasopharyngeal Swab  Result Value Ref Range Status   SARS Coronavirus 2 by RT PCR NEGATIVE NEGATIVE Final    Comment: (NOTE) SARS-CoV-2 target nucleic acids are NOT DETECTED. The SARS-CoV-2 RNA is generally detectable in upper respiratoy specimens during the acute phase of infection. The lowest concentration of SARS-CoV-2 viral copies this assay can detect is 131 copies/mL. A negative result does not preclude SARS-Cov-2 infection and should not be used as the sole basis for treatment or other patient management decisions. A negative result may occur with  improper specimen collection/handling, submission of specimen other than nasopharyngeal swab, presence of viral mutation(s) within the areas targeted by this assay, and inadequate number of viral copies (<131 copies/mL). A negative result must be combined with clinical observations, patient history, and epidemiological information. The expected result is Negative. Fact Sheet for Patients:  https://www.moore.com/ Fact Sheet for Healthcare Providers:  https://www.young.biz/ This test is not yet ap proved or cleared by the [place] FDA and  has been authorized for detection and/or diagnosis of SARS-CoV-2 by FDA under an Emergency Use Authorization (EUA). This EUA will remain  in effect (meaning this test can be used) for the duration of the COVID-19 declaration under Section 564(b)(1) of the Act, 21 U.S.C. section 360bbb-3(b)(1), unless the authorization is terminated or revoked sooner.    Influenza A by PCR NEGATIVE NEGATIVE Final   Influenza B by PCR NEGATIVE NEGATIVE Final    Comment:  (NOTE) The Xpert Xpress SARS-CoV-2/FLU/RSV assay is intended as an aid in  the diagnosis of influenza from Nasopharyngeal swab specimens and  should not be used as a sole basis for treatment. Nasal washings and  aspirates are unacceptable for Xpert Xpress SARS-CoV-2/FLU/RSV  testing. Fact Sheet for Patients: https://www.moore.com/ Fact Sheet for Healthcare Providers: https://www.young.biz/ This test is not yet approved or cleared by the [place] FDA and  has been authorized for detection and/or diagnosis of SARS-CoV-2 by  FDA under an Emergency Use Authorization (EUA). This EUA will remain  in effect (meaning this test can be used) for the duration of the  Covid-19 declaration under Section 564(b)(1) of the Act, 21  U.S.C.  section 360bbb-3(b)(1), unless the authorization is  terminated or revoked. Performed at [facility], [address] [place], [place] [address]   MRSA PCR Screening     Status: None   Collection Time: 02/09/20 11:53 PM   Specimen: Nasal Mucosa; Nasopharyngeal  Result Value Ref Range Status   MRSA by PCR NEGATIVE NEGATIVE Final    Comment:        The GeneXpert MRSA Assay (FDA approved for NASAL specimens only), is one component of a comprehensive MRSA colonization surveillance program. It is not intended to diagnose MRSA infection nor to guide or monitor treatment for MRSA infections. Performed at [facility], [address], [place], [place] [address]      Labs: BNP (last 3 results) No results for input(s): BNP in the last 8760 hours. Basic Metabolic Panel: Recent Labs  Lab 02/09/20 1907 02/10/20 0257 02/11/20 0429  NA 139 139 142  K 4.5 4.1 3.4*  CL 95* 100 107  CO2 24 25 22   GLUCOSE 165* 161* 142*  BUN 59* 63* 65*  CREATININE 2.19* 1.96* 1.48*  CALCIUM 9.5 8.4* 7.7*  MG  --   --  2.0   Liver Function Tests: Recent Labs  Lab 02/09/20 1907 02/10/20 0257  AST 33 26  ALT <5 20  ALKPHOS 48 41   BILITOT 1.3* 1.4*  PROT 8.5* 6.9  ALBUMIN 4.2 3.5   Recent Labs  Lab 02/09/20 1907  LIPASE 48   No results for input(s): AMMONIA in the last 168 hours. CBC: Recent Labs  Lab 02/09/20 1907 02/10/20 0257 02/11/20 0429  WBC 11.6* 10.0 7.9  NEUTROABS 9.5*  --   --   HGB 15.5* 13.7 13.1  HCT 48.7* 42.6 41.3  MCV 90.2 91.0 91.0  PLT 183 164 145*   Cardiac Enzymes: No results for input(s): CKTOTAL, CKMB, CKMBINDEX, TROPONINI in the last 168 hours. BNP: Invalid input(s): POCBNP CBG: No results for input(s): GLUCAP in the last 168 hours. D-Dimer No results for input(s): DDIMER in the last 72 hours. Hgb A1c No results for input(s): HGBA1C in the last 72 hours. Lipid Profile No results for input(s): CHOL, HDL, LDLCALC, TRIG, CHOLHDL, LDLDIRECT in the last 72 hours. Thyroid function studies Recent Labs    02/10/20 2001  TSH 1.059   Anemia work up No results for input(s): VITAMINB12, FOLATE, FERRITIN, TIBC, IRON, RETICCTPCT in the last 72 hours. Urinalysis    Component Value Date/Time   COLORURINE YELLOW 02/09/2020 1842   APPEARANCEUR CLOUDY (A) 02/09/2020 1842   LABSPEC 1.022 02/09/2020 1842   PHURINE 5.0 02/09/2020 1842   GLUCOSEU NEGATIVE 02/09/2020 1842   HGBUR SMALL (A) 02/09/2020 1842   BILIRUBINUR NEGATIVE 02/09/2020 1842   KETONESUR NEGATIVE 02/09/2020 1842   PROTEINUR >=300 (A) 02/09/2020 1842   NITRITE NEGATIVE 02/09/2020 1842   LEUKOCYTESUR NEGATIVE 02/09/2020 1842   Sepsis Labs Invalid input(s): PROCALCITONIN,  WBC,  LACTICIDVEN Microbiology Recent Results (from the past 240 hour(s))  Blood culture (routine x 2)     Status: None (Preliminary result)   Collection Time: 02/09/20  9:24 PM   Specimen: BLOOD LEFT ARM  Result Value Ref Range Status   Specimen Description BLOOD LEFT ARM  Final   Special Requests   Final    BOTTLES DRAWN AEROBIC AND ANAEROBIC Blood Culture adequate volume   Culture   Final    NO GROWTH 2 DAYS Performed at [facility], [address] [place], [place]    Report Status PENDING  Incomplete  Blood culture (routine x 2)     Status: None (Preliminary result)   Collection Time: 02/09/20  9:27 PM   Specimen: BLOOD LEFT HAND  Result Value Ref Range Status   Specimen Description BLOOD LEFT HAND  Final   Special Requests   Final    BOTTLES DRAWN AEROBIC AND ANAEROBIC Blood Culture adequate volume   Culture   Final    NO GROWTH 2 DAYS Performed at [facility], [address] [place], [place] [id number]    Report Status PENDING  Incomplete  Respiratory Panel by RT PCR (Flu A&B, Covid) - Nasopharyngeal Swab     Status: None   Collection Time: 02/09/20  9:29 PM   Specimen: Nasopharyngeal Swab  Result Value Ref Range Status   SARS Coronavirus 2 by RT PCR NEGATIVE NEGATIVE Final    Comment: (NOTE) SARS-CoV-2 target nucleic acids are NOT DETECTED. The SARS-CoV-2 RNA is generally detectable in upper respiratoy specimens during the acute phase of infection. The lowest concentration of SARS-CoV-2 viral copies this assay can detect is 131 copies/mL. A negative result does not preclude SARS-Cov-2 infection and should not be used as the sole basis for treatment or other patient management decisions. A negative result may occur with  improper specimen collection/handling, submission of specimen other than nasopharyngeal swab, presence of viral mutation(s) within the areas targeted by this assay, and inadequate number of viral copies (<131 copies/mL). A negative result must be combined with clinical observations, patient history, and epidemiological information. The expected result is Negative. Fact Sheet for Patients:  https://www.moore.com/https://www.fda.gov/media/142436/download Fact Sheet for Healthcare Providers:  https://www.young.biz/https://www.fda.gov/media/142435/download This test is not yet ap proved or cleared by the [place] FDA and  has been authorized for detection and/or diagnosis of SARS-CoV-2 by FDA under an Emergency Use  Authorization (EUA). This EUA will remain  in effect (meaning this test can be used) for the duration of the COVID-19 declaration under Section 564(b)(1) of the Act, 21 U.S.C. section 360bbb-3(b)(1), unless the authorization is terminated or revoked sooner.    Influenza A by PCR NEGATIVE NEGATIVE Final   Influenza B by PCR NEGATIVE NEGATIVE Final    Comment: (NOTE) The Xpert Xpress SARS-CoV-2/FLU/RSV assay is intended as an aid in  the diagnosis of influenza from Nasopharyngeal swab specimens and  should not be used as a sole basis for treatment. Nasal washings and  aspirates are unacceptable for Xpert Xpress SARS-CoV-2/FLU/RSV  testing. Fact Sheet for Patients: https://www.moore.com/https://www.fda.gov/media/142436/download Fact Sheet for Healthcare Providers: https://www.young.biz/https://www.fda.gov/media/142435/download This test is not yet approved or cleared by the [place] FDA and  has been authorized for detection and/or diagnosis of SARS-CoV-2 by  FDA under an Emergency Use Authorization (EUA). This EUA will remain  in effect (meaning this test can be used) for the duration of the  Covid-19 declaration under Section 564(b)(1) of the Act, 21  U.S.C. section 360bbb-3(b)(1), unless the authorization is  terminated or revoked. Performed at [facility], [address] [place], [place] [address]   MRSA PCR Screening     Status: None   Collection Time: 02/09/20 11:53 PM   Specimen: Nasal Mucosa; Nasopharyngeal  Result Value Ref Range Status   MRSA by PCR NEGATIVE NEGATIVE Final    Comment:        The GeneXpert MRSA Assay (FDA approved for NASAL specimens only), is one component of a comprehensive MRSA colonization surveillance program. It is not intended to diagnose MRSA infection nor to guide or monitor treatment for MRSA infections. Performed  at [facility], [address] [place], [place] [address]    Time coordinating discharge: 40 mins  SIGNED:  Standley Dakins, MD  [facility] 02/11/2020, 12:55 PM How to contact the [facility] Attending or Consulting provider 7A - 7P or covering provider during after hours 7P -7A, for this patient?  1. Check the care team in [facility] and look for a) attending/consulting TRH provider listed and b) the [facility] team listed 2. Log into [email or web address] and use [place]'s universal password to access. If you do not have the password, please contact the hospital operator. 3. Locate the [facility] provider you are looking for under [facility] and page to a number that you can be directly reached. 4. If you still have difficulty reaching the provider, please page the [facility] (Director on Call) for the Hospitalists listed on amion for assistance.

## 2020-02-11 NOTE — TOC Initial Note (Signed)
Transition of Care [facility]) - Initial/Assessment Note    Patient Details  Name: Beth Terry MRN: [id number] Date of Birth: [birth year]  [facility] CM/SW Contact:    Lavell Supple Dimitri Ped, LCSW Phone Number: 02/11/2020, 12:29 PM  Clinical Narrative:   CSW in contact with Cassandra from [facility]. Cassandra accepted referral and states that she will be in contact with the family. CSW was informed by attending provider that a hospital bed may be needed.   CSW later in contact with patients daughter to offer equipment services such as hospital bed. [name] PH:     [phone] was receptive. Equipment will be ordered by [place] from [place] [facility].   TOC will continue to follow patient for discharge related needs.   [place] [facility]  Clinical Social Worker  Ph: [phone]            Expected Discharge Plan: Home w Hospice Care Barriers to Discharge: Equipment Delay, Continued Medical Work up   Patient Goals and CMS Choice Patient states their goals for this hospitalization and ongoing recovery are:: Patients daughter explains that the goal is for patient to discharge home with hospice      Expected Discharge Plan and Services Expected Discharge Plan: Home w Hospice Care In-house Referral: Clinical Social Work Discharge Planning Services: CM Consult Post Acute Care Choice: Hospice Living arrangements for the past 2 months: Single Family Home                 DME Arranged: Hospital bed           [facility] Agency: [facility] Date [place]: 02/11/20 Time HH Agency Contacted: 49 Representative spoke with at [place]: [place]  Prior Living Arrangements/Services Living arrangements for the past 2 months: [place] with:: Adult Children Patient language and need for interpreter reviewed:: Yes Do you feel safe going back to the place where you live?: Yes      Need  for Family Participation in Patient Care: Yes (Comment) Care giver support system in place?: Yes (comment)   Criminal Activity/Legal Involvement Pertinent to Current Situation/Hospitalization: No - Comment as needed  Activities of Daily Living Home Assistive Devices/Equipment: Eyeglasses ADL Screening (condition at time of admission) Patient's cognitive ability adequate to safely complete daily activities?: Yes Is the patient deaf or have difficulty hearing?: No Does the patient have difficulty seeing, even when wearing glasses/contacts?: No Does the patient have difficulty concentrating, remembering, or making decisions?: No Patient able to express need for assistance with ADLs?: Yes Does the patient have difficulty dressing or bathing?: No Independently performs ADLs?: Yes (appropriate for developmental age) Does the patient have difficulty walking or climbing stairs?: No Weakness of Legs: None Weakness of Arms/Hands: None  Permission Sought/Granted Permission sought to share information with : Case Manager Permission granted to share information with : Yes, Release of Information Signed  Share Information with NAME: Daisy Powell-Swinton  Permission granted to share info w AGENCY: [place] granted to share info w Relationship: Daughter  Permission granted to share info w Contact Information: [phone]  Emotional Assessment           Psych Involvement: No (comment)  Admission diagnosis:  SBO (small bowel obstruction) ([place]) [K56.609] Encounter for nasogastric (NG) tube placement [Z46.59] Atrial fibrillation, unspecified type ([place]) [I48.91] Intestinal obstruction, unspecified cause, unspecified whether partial or complete ([place]) [K56.609] Patient Active Problem List   Diagnosis Date Noted  .  Atrial flutter with rapid ventricular response (HCC) 02/10/2020  . Lactic acidosis 02/10/2020  . Atrial flutter (HCC)   . Coagulopathy (HCC)   . AKI  (acute kidney injury) (HCC)   . Dehydration   . Hypertension   . Hyperlipidemia   . Goals of care, counseling/discussion   . Palliative care by specialist   . Encounter for hospice care discussion   . SBO (small bowel obstruction) (HCC) 02/09/2020   PCP:  Avon Gully, MD Pharmacy:   [facility] [place], [place] - [phone] [place] [address] Phone: [phone] Fax: [phone]     Social Determinants of Health (SDOH) Interventions    Readmission Risk Interventions No flowsheet data found.

## 2020-02-11 NOTE — Progress Notes (Signed)
Pt's discharge instructions printed and provided in discharge papers for transport. No family at bedside, family at home awaiting patients arrival. Pt bladder scanned and revealed 450cc. Pt I&O with 600 out. Called Daisy pts daughter to inform pt is awaiting transport home. PT iv and monitor d/c.

## 2020-02-11 NOTE — Progress Notes (Signed)
CRITICAL VALUE ALERT  Critical Value:  Lactic 3.1 Date & Time Notied: 02/11/20 1130  Provider Notified: Dr Laural Benes  Orders Received/Actions taken: none at this time

## 2020-02-11 NOTE — Progress Notes (Signed)
CRITICAL VALUE ALERT  Critical Value:  Lactic 2.8  Date & Time Notied: 02/11/20 0810  Provider Notified: Dr. Darlis Loan bedside)  Orders Received/Actions taken: new orders received

## 2020-02-14 LAB — CULTURE, BLOOD (ROUTINE X 2)
Culture: NO GROWTH
Culture: NO GROWTH
Special Requests: ADEQUATE
Special Requests: ADEQUATE

## 2020-02-22 ENCOUNTER — Telehealth: Payer: Self-pay | Admitting: Cardiovascular Disease

## 2020-02-22 NOTE — Telephone Encounter (Signed)
Forward to provider as FYI

## 2020-02-22 NOTE — Telephone Encounter (Signed)
Joann Cobb (Daughter) Daisy Powell-Swinton (Dau) Showing 2 of 2   [phone]

## 2020-02-22 NOTE — Telephone Encounter (Signed)
Patient's daughter called to thank everyone in our office for all that we did for her mother.  Her mother passed away 03/03/20.  She also would like Dr Purvis Sheffield to know how thankful she is that he cared enough to ask her if she was having any symptoms during her mother's visit.  She is now having to wear a monitor to evaluate her heart flutters she stated.

## 2020-02-22 NOTE — Telephone Encounter (Signed)
I am sorry to hear that. If you have her daughter's contact info I can reach out to her and convey my condolences (Joann).

## 2020-02-24 NOTE — Telephone Encounter (Signed)
I tried calling the number listed for Beth Terry both yesterday and today but there was no answer and no option to leave a voicemail.

## 2020-02-24 NOTE — Telephone Encounter (Signed)
Noted  

## 2020-03-14 DEATH — deceased

## 2020-04-11 ENCOUNTER — Telehealth: Payer: Medicaid Other | Admitting: Cardiovascular Disease

## 2020-05-10 ENCOUNTER — Ambulatory Visit: Payer: Medicare HMO | Admitting: Podiatry

## 2021-11-05 IMAGING — DX DG CHEST 1V PORT
1 series · 2 of 2 positions shown · non-contrast
Comparison: [DATE] [DATE], [DATE] [DATE] p.m.

CLINICAL DATA: NG tube repositioning

EXAM:
PORTABLE CHEST 1 VIEW

[Series 1: chest ap · 0.14mm/px · 2 of 2 slices shown]
[im 1/2]
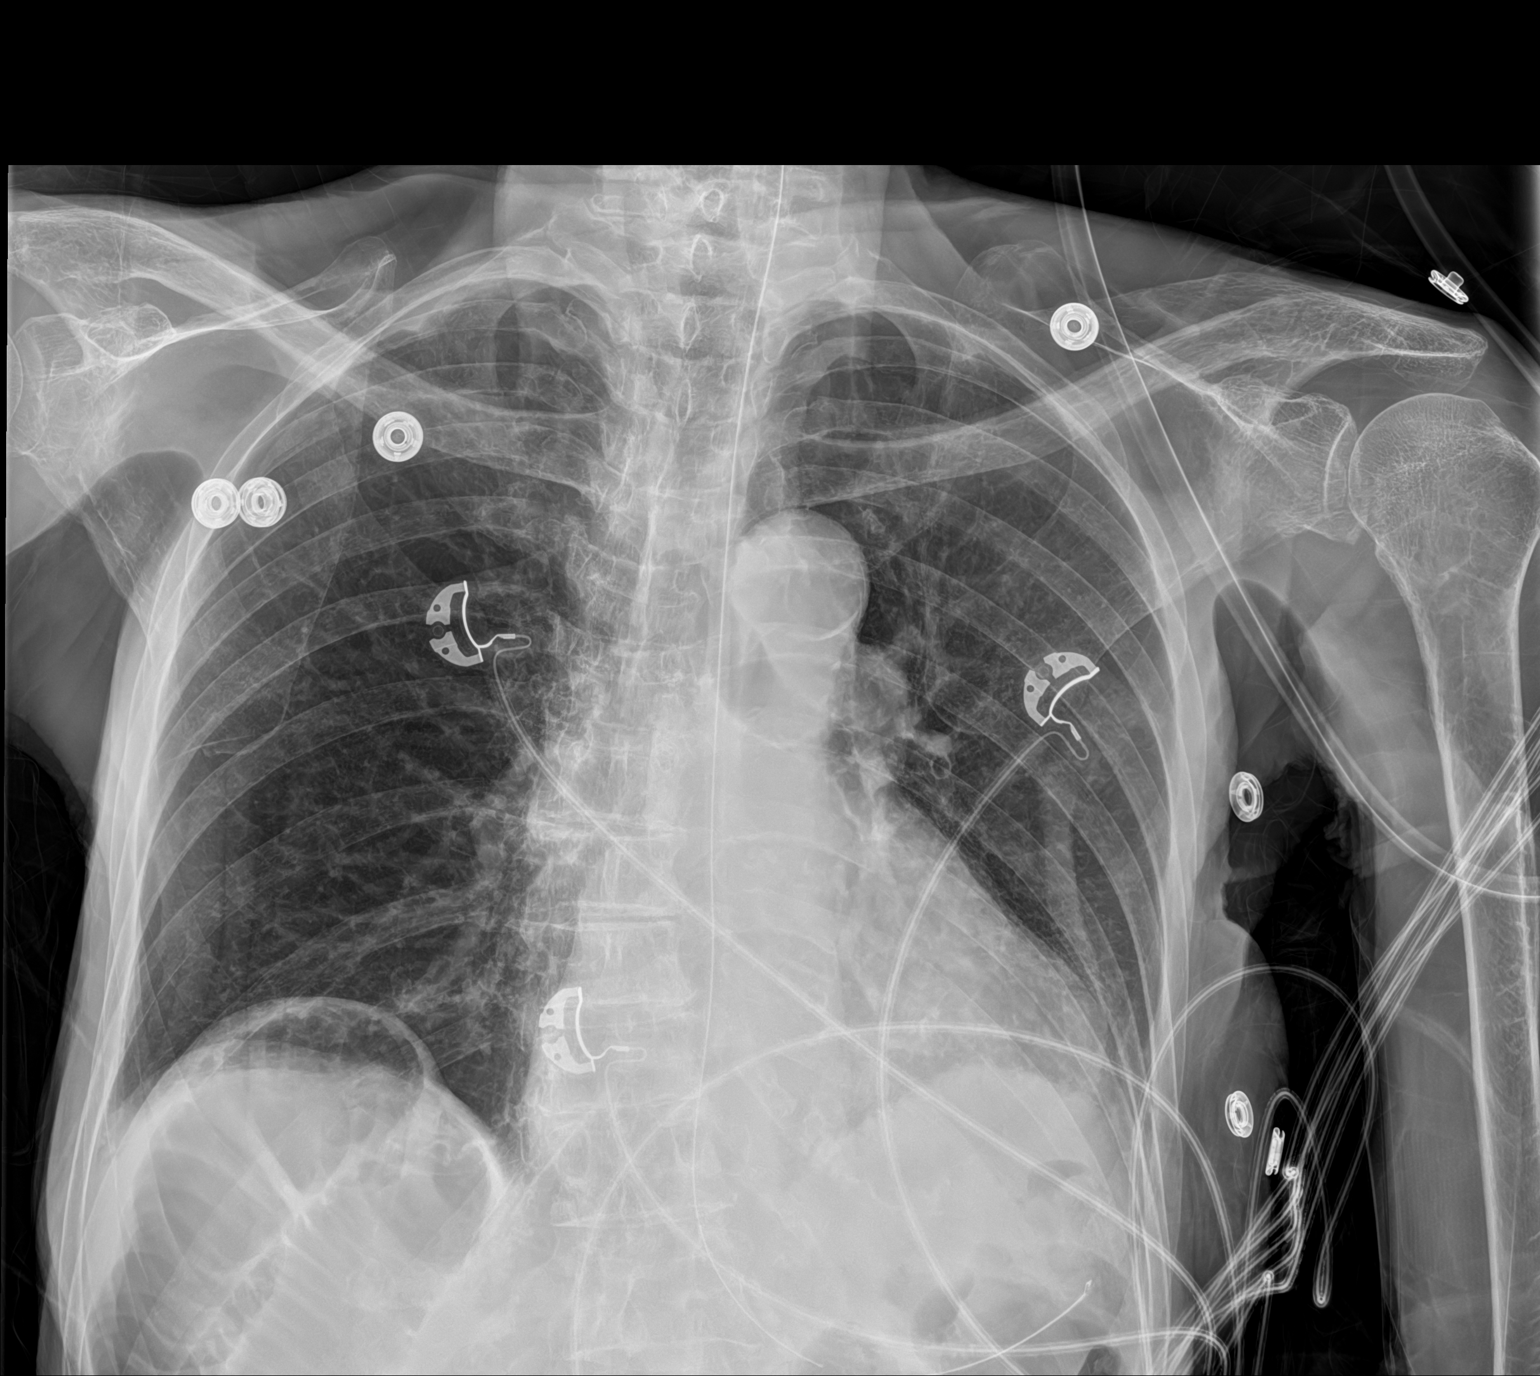
[im 2/2]
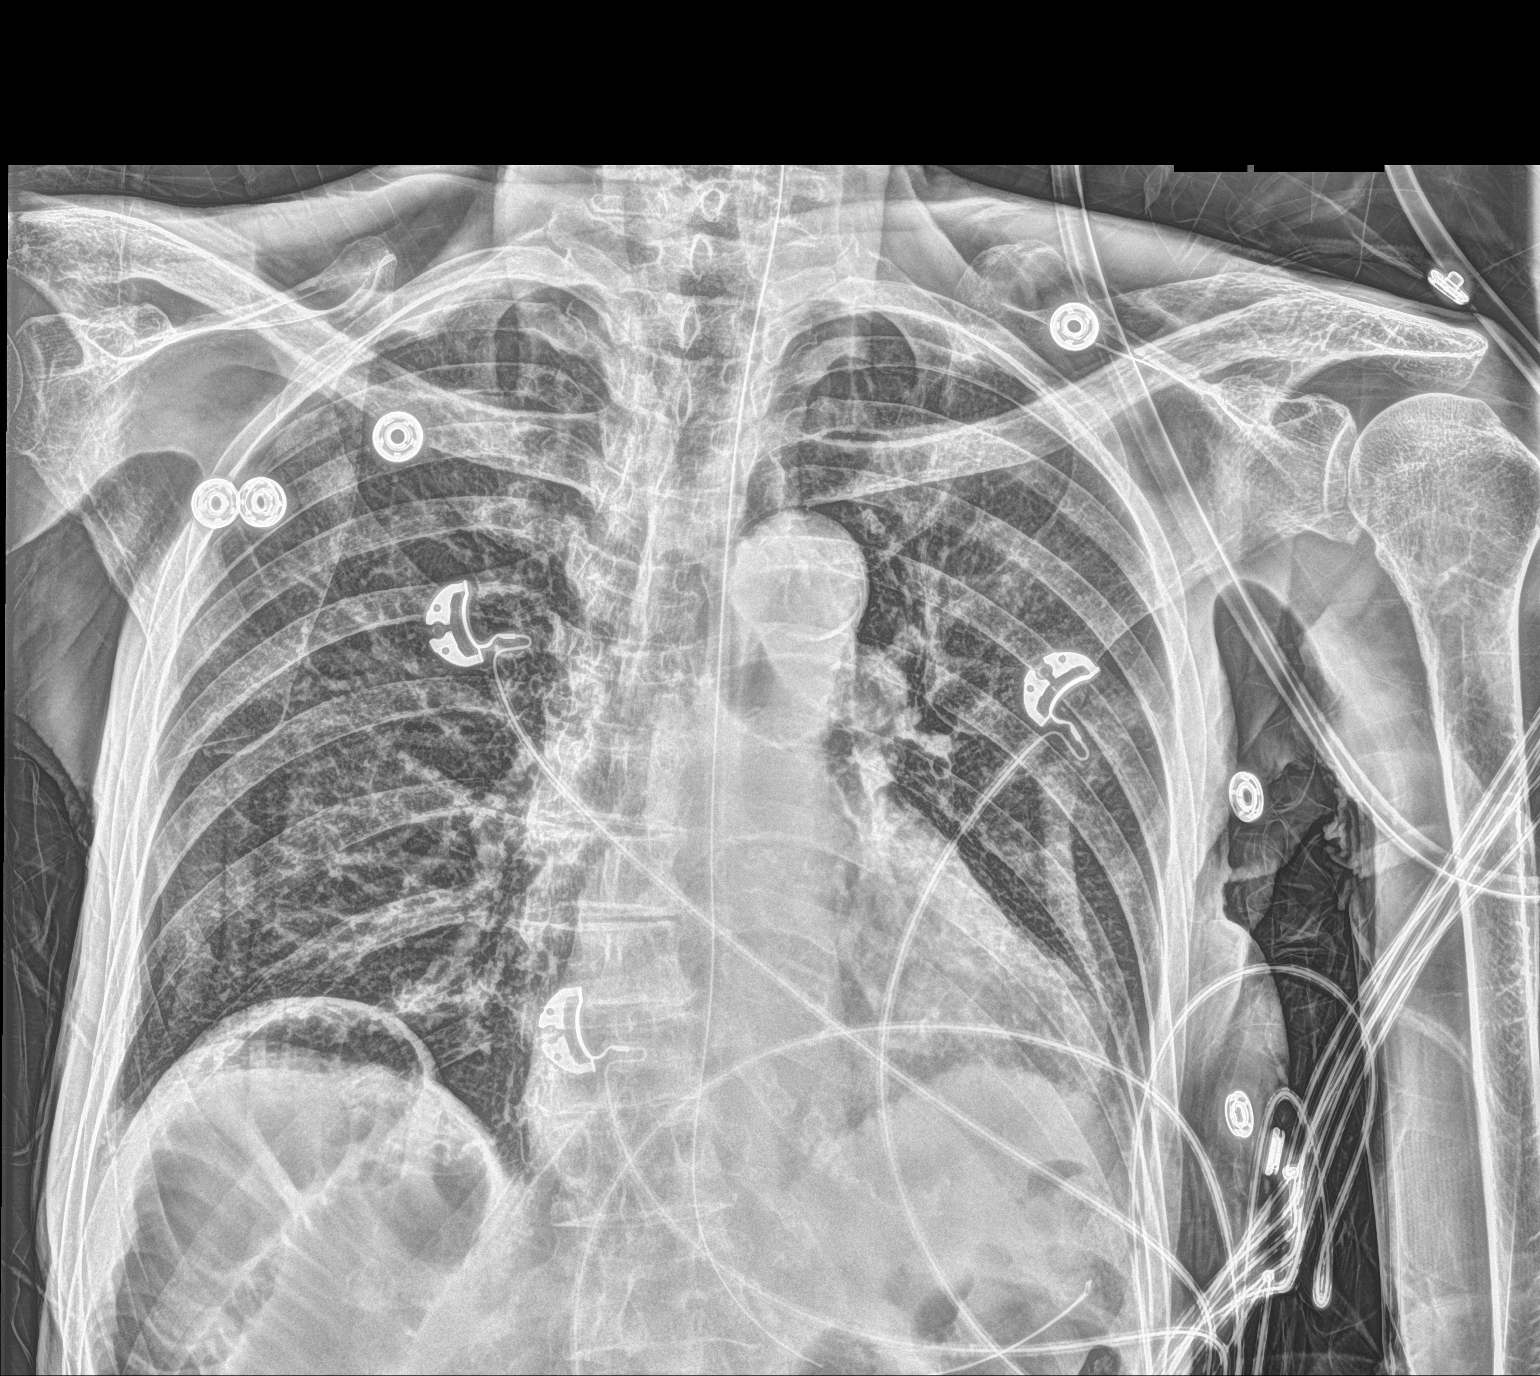

[2 of 2 positions shown; findings below may reference images not displayed]

FINDINGS: Again noted is cardiomegaly. Aortic knob calcifications. There is
interval repositioning of the NG tube which is not seen below the
diaphragm within the proximal stomach. There is mildly dilated
air-filled loops of small bowel within the right upper quadrant. The
lungs are clear. No focal airspace consolidation. No acute osseous
abnormality.
IMPRESSION: *Interval repositioning of the NG tube within the proximal stomach.
*Mildly dilated air-filled loops of small bowel in the right upper
quadrant.

## 2021-11-06 IMAGING — DX DG ABDOMEN ACUTE W/ 1V CHEST
3 series · 3 of 3 positions shown · non-contrast
Comparison: 02/09/2020 CT abdomen/pelvis

CLINICAL DATA: Small bowel obstruction, sepsis

EXAM:
DG ABDOMEN ACUTE W/ 1V CHEST

[abdomen erect]
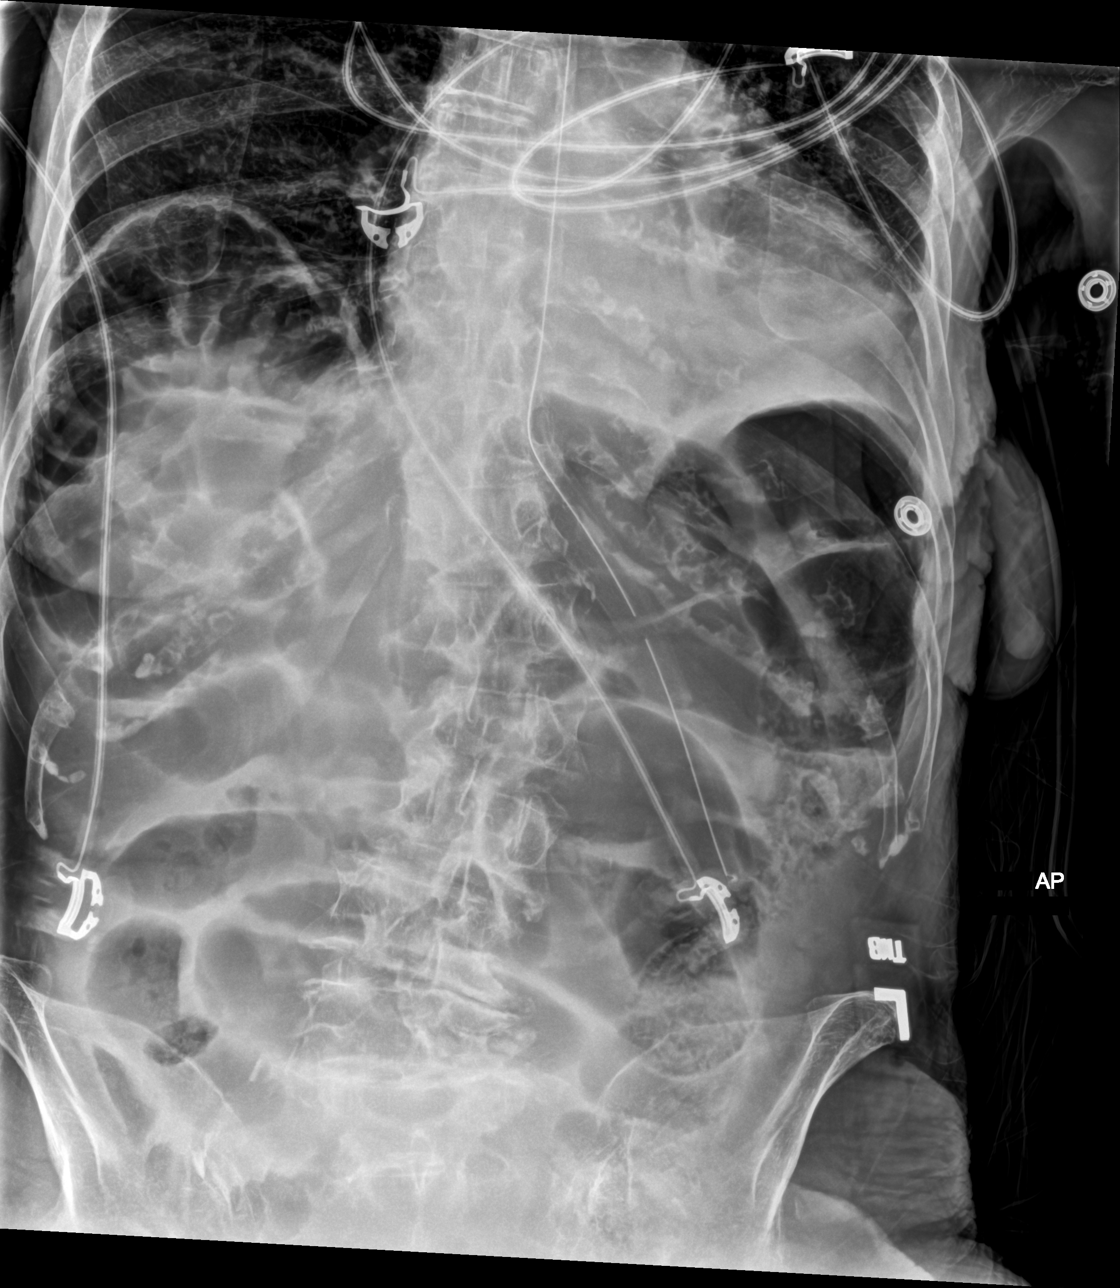

[abdomen supine]
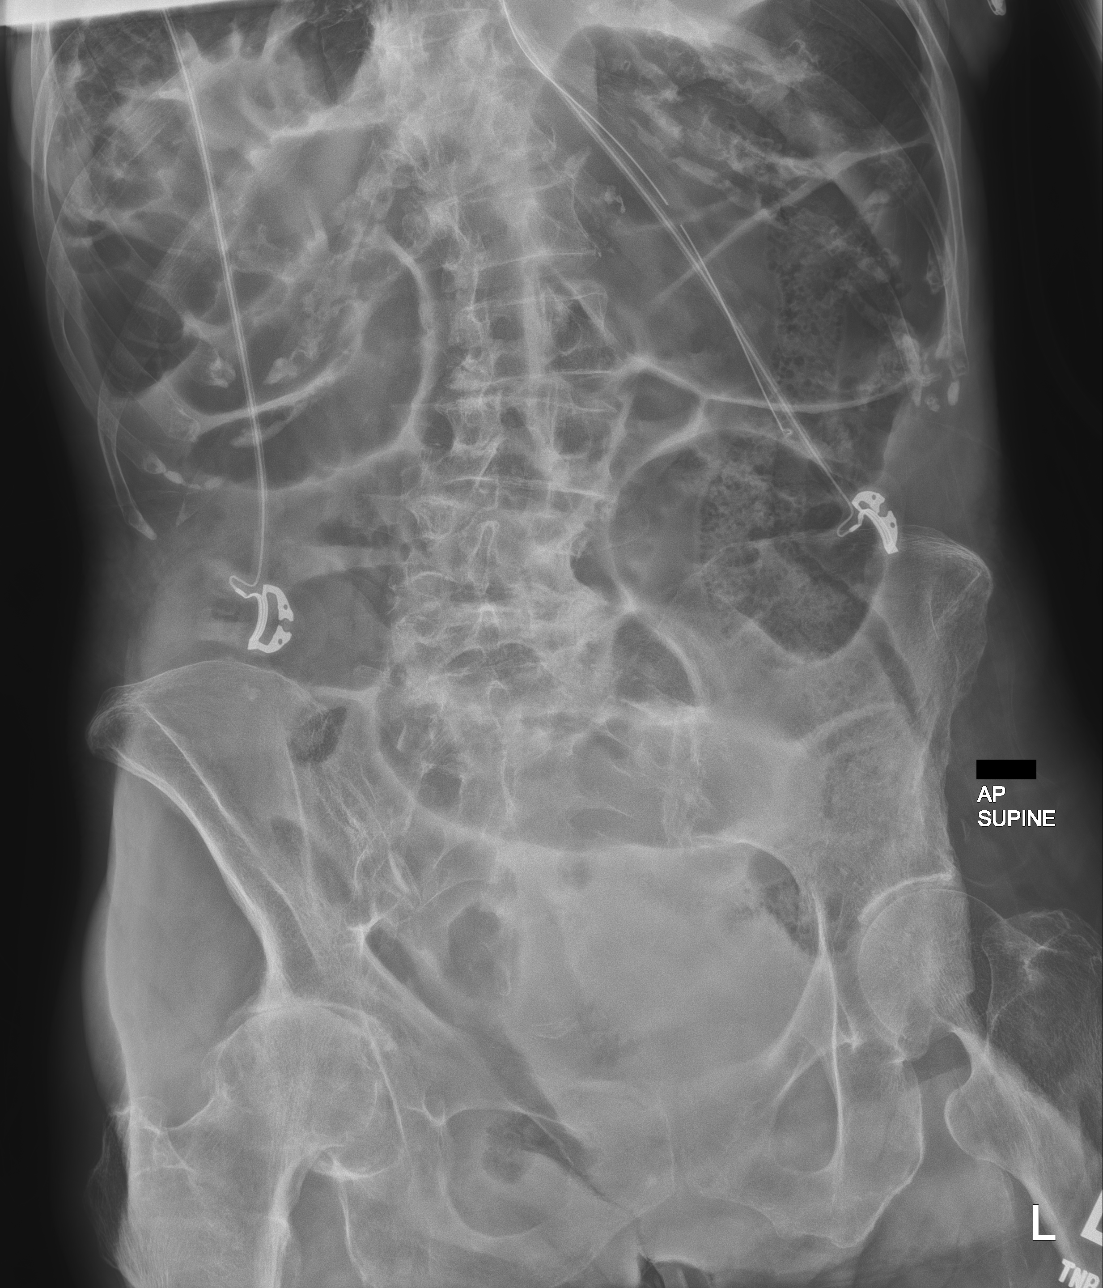

[chest ap]
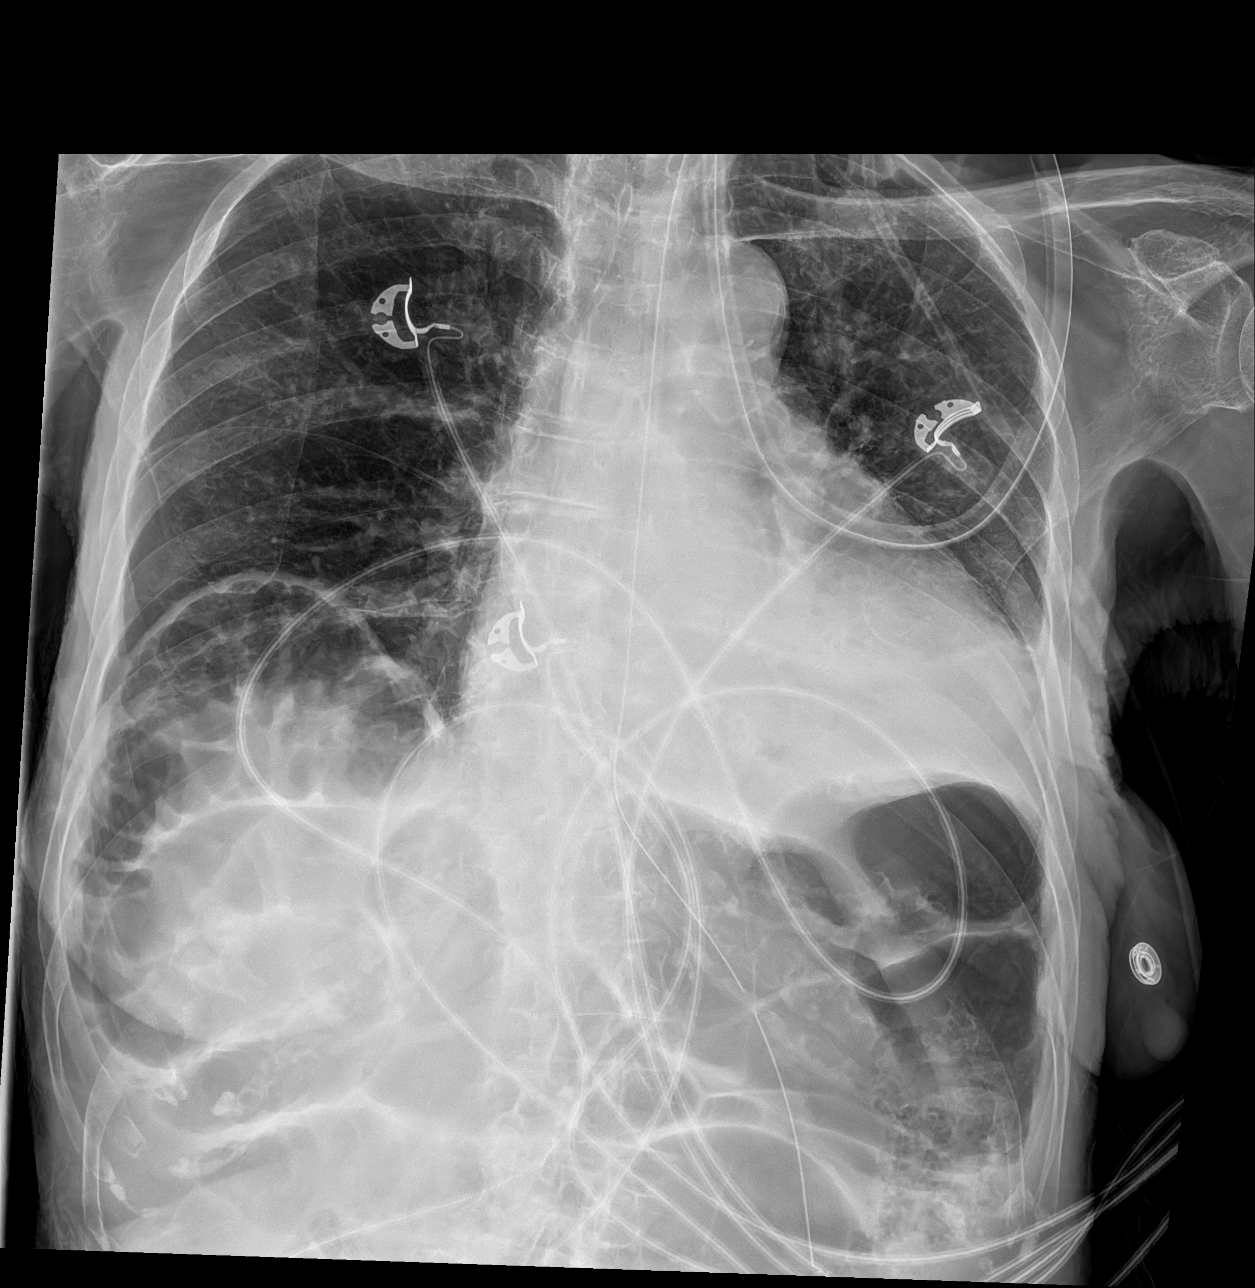

[3 of 3 positions shown; findings below may reference images not displayed]

FINDINGS: Enteric tube terminates in body of the stomach. Diffuse small bowel
dilatation up to 4.5 cm diameter, not substantially changed. No
evidence of pneumatosis or pneumoperitoneum. Mild colonic stool.
Marked lumbar spondylosis.
IMPRESSION: 1. Diffuse small bowel dilatation compatible with distal small-bowel
obstruction, not substantially changed.
2. Enteric tube terminates in the body of the stomach.
